# Patient Record
Sex: Female | Born: 1970 | Race: White | Hispanic: No | Marital: Married | Smoking: Never smoker
Health system: Southern US, Community
[De-identification: ages and names within clinical notes are randomized; demographics above are authoritative.]

## PROBLEM LIST (undated history)

## (undated) ENCOUNTER — Inpatient Hospital Stay (HOSPITAL_COMMUNITY): Payer: Self-pay

## (undated) DIAGNOSIS — M549 Dorsalgia, unspecified: Secondary | ICD-10-CM

## (undated) DIAGNOSIS — I471 Supraventricular tachycardia, unspecified: Secondary | ICD-10-CM

## (undated) DIAGNOSIS — M2550 Pain in unspecified joint: Secondary | ICD-10-CM

## (undated) DIAGNOSIS — E559 Vitamin D deficiency, unspecified: Secondary | ICD-10-CM

## (undated) DIAGNOSIS — O09529 Supervision of elderly multigravida, unspecified trimester: Secondary | ICD-10-CM

## (undated) DIAGNOSIS — E039 Hypothyroidism, unspecified: Secondary | ICD-10-CM

## (undated) DIAGNOSIS — M7989 Other specified soft tissue disorders: Secondary | ICD-10-CM

## (undated) DIAGNOSIS — I499 Cardiac arrhythmia, unspecified: Secondary | ICD-10-CM

## (undated) DIAGNOSIS — B009 Herpesviral infection, unspecified: Secondary | ICD-10-CM

## (undated) DIAGNOSIS — D649 Anemia, unspecified: Secondary | ICD-10-CM

## (undated) HISTORY — DX: Anemia, unspecified: D64.9

## (undated) HISTORY — DX: Pain in unspecified joint: M25.50

## (undated) HISTORY — DX: Vitamin D deficiency, unspecified: E55.9

## (undated) HISTORY — DX: Supraventricular tachycardia: I47.1

## (undated) HISTORY — DX: Cardiac arrhythmia, unspecified: I49.9

## (undated) HISTORY — PX: HAND SURGERY: SHX662

## (undated) HISTORY — PX: CARDIAC CATHETERIZATION: SHX172

## (undated) HISTORY — DX: Supervision of elderly multigravida, unspecified trimester: O09.529

## (undated) HISTORY — DX: Other specified soft tissue disorders: M79.89

## (undated) HISTORY — DX: Herpesviral infection, unspecified: B00.9

## (undated) HISTORY — DX: Dorsalgia, unspecified: M54.9

## (undated) HISTORY — DX: Supraventricular tachycardia, unspecified: I47.10

---

## 1997-12-27 ENCOUNTER — Other Ambulatory Visit: Admission: RE | Admit: 1997-12-27 | Discharge: 1997-12-27 | Payer: Self-pay | Admitting: Gynecology

## 1999-01-05 ENCOUNTER — Other Ambulatory Visit: Admission: RE | Admit: 1999-01-05 | Discharge: 1999-01-05 | Payer: Self-pay | Admitting: Gynecology

## 2000-01-13 ENCOUNTER — Other Ambulatory Visit: Admission: RE | Admit: 2000-01-13 | Discharge: 2000-01-13 | Payer: Self-pay | Admitting: Gynecology

## 2001-02-07 ENCOUNTER — Other Ambulatory Visit: Admission: RE | Admit: 2001-02-07 | Discharge: 2001-02-07 | Payer: Self-pay | Admitting: Obstetrics and Gynecology

## 2002-02-12 ENCOUNTER — Other Ambulatory Visit: Admission: RE | Admit: 2002-02-12 | Discharge: 2002-02-12 | Payer: Self-pay | Admitting: Obstetrics and Gynecology

## 2003-02-18 ENCOUNTER — Other Ambulatory Visit: Admission: RE | Admit: 2003-02-18 | Discharge: 2003-02-18 | Payer: Self-pay | Admitting: Obstetrics and Gynecology

## 2003-02-19 ENCOUNTER — Other Ambulatory Visit: Admission: RE | Admit: 2003-02-19 | Discharge: 2003-02-19 | Payer: Self-pay | Admitting: Obstetrics and Gynecology

## 2004-03-05 ENCOUNTER — Other Ambulatory Visit: Admission: RE | Admit: 2004-03-05 | Discharge: 2004-03-05 | Payer: Self-pay | Admitting: Obstetrics and Gynecology

## 2005-03-10 ENCOUNTER — Other Ambulatory Visit: Admission: RE | Admit: 2005-03-10 | Discharge: 2005-03-10 | Payer: Self-pay | Admitting: Obstetrics and Gynecology

## 2006-12-21 ENCOUNTER — Emergency Department (HOSPITAL_COMMUNITY): Admission: EM | Admit: 2006-12-21 | Discharge: 2006-12-21 | Payer: Self-pay | Admitting: Emergency Medicine

## 2007-03-02 HISTORY — PX: NASAL SINUS SURGERY: SHX719

## 2007-05-08 ENCOUNTER — Encounter: Admission: RE | Admit: 2007-05-08 | Discharge: 2007-05-08 | Payer: Self-pay | Admitting: Obstetrics and Gynecology

## 2007-05-17 ENCOUNTER — Encounter: Admission: RE | Admit: 2007-05-17 | Discharge: 2007-05-17 | Payer: Self-pay | Admitting: Obstetrics and Gynecology

## 2007-11-07 ENCOUNTER — Encounter: Admission: RE | Admit: 2007-11-07 | Discharge: 2007-11-07 | Payer: Self-pay | Admitting: Obstetrics and Gynecology

## 2007-11-30 HISTORY — PX: SVT ABLATION: EP1225

## 2007-12-15 ENCOUNTER — Ambulatory Visit: Payer: Self-pay | Admitting: Internal Medicine

## 2007-12-15 LAB — CONVERTED CEMR LAB
Basophils Absolute: 0 10*3/uL (ref 0.0–0.1)
CO2: 27 meq/L (ref 19–32)
Creatinine, Ser: 0.7 mg/dL (ref 0.4–1.2)
GFR calc Af Amer: 122 mL/min
GFR calc non Af Amer: 101 mL/min
Glucose, Bld: 92 mg/dL (ref 70–99)
INR: 1 (ref 0.8–1.0)
MCV: 92.2 fL (ref 78.0–100.0)
Monocytes Relative: 5.5 % (ref 3.0–12.0)
Neutro Abs: 6.5 10*3/uL (ref 1.4–7.7)
Potassium: 3.9 meq/L (ref 3.5–5.1)
Prothrombin Time: 11.8 s (ref 10.9–13.3)
RBC: 4.52 M/uL (ref 3.87–5.11)
RDW: 11.5 % (ref 11.5–14.6)
WBC: 9.7 10*3/uL (ref 4.5–10.5)
aPTT: 30.4 s — ABNORMAL HIGH (ref 21.7–29.8)

## 2007-12-20 ENCOUNTER — Ambulatory Visit: Payer: Self-pay | Admitting: Internal Medicine

## 2007-12-20 ENCOUNTER — Ambulatory Visit (HOSPITAL_COMMUNITY): Admission: RE | Admit: 2007-12-20 | Discharge: 2007-12-21 | Payer: Self-pay | Admitting: Internal Medicine

## 2007-12-27 ENCOUNTER — Ambulatory Visit: Payer: Self-pay | Admitting: Internal Medicine

## 2007-12-27 ENCOUNTER — Ambulatory Visit: Payer: Self-pay

## 2007-12-27 ENCOUNTER — Ambulatory Visit: Payer: Self-pay | Admitting: Cardiology

## 2008-01-18 ENCOUNTER — Ambulatory Visit: Payer: Self-pay | Admitting: Internal Medicine

## 2008-01-24 DIAGNOSIS — R42 Dizziness and giddiness: Secondary | ICD-10-CM | POA: Insufficient documentation

## 2008-01-24 DIAGNOSIS — R0989 Other specified symptoms and signs involving the circulatory and respiratory systems: Secondary | ICD-10-CM | POA: Insufficient documentation

## 2008-05-08 ENCOUNTER — Encounter: Admission: RE | Admit: 2008-05-08 | Discharge: 2008-05-08 | Payer: Self-pay | Admitting: Obstetrics and Gynecology

## 2009-01-06 IMAGING — CR DG FOOT COMPLETE 3+V*R*
3 series · 3 of 3 positions shown · non-contrast
Comparison: none

CLINICAL DATA: Fell at work.  Right foot pain.  Additional clinical history obtained from Emergency Room, staging that the pain is in the region of lateral calcaneus to the base of the 5th metatarsal. 
RIGHT FOOT - 3 VIEW:

[t foot ap right]
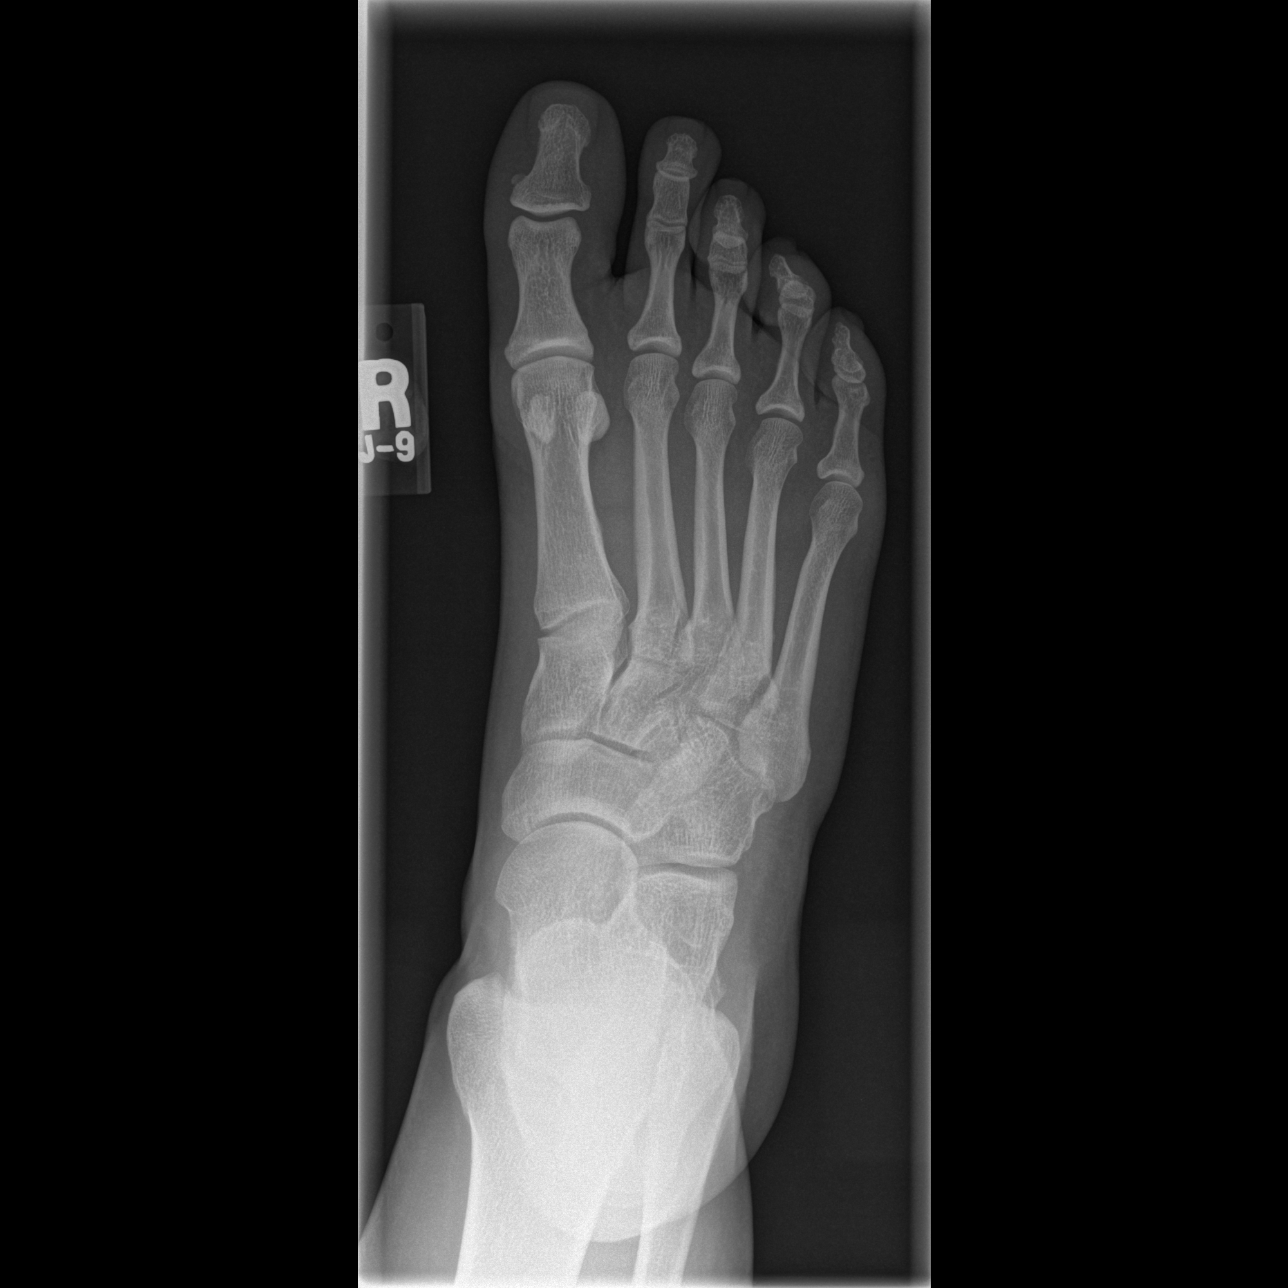

[t foot oblique right]
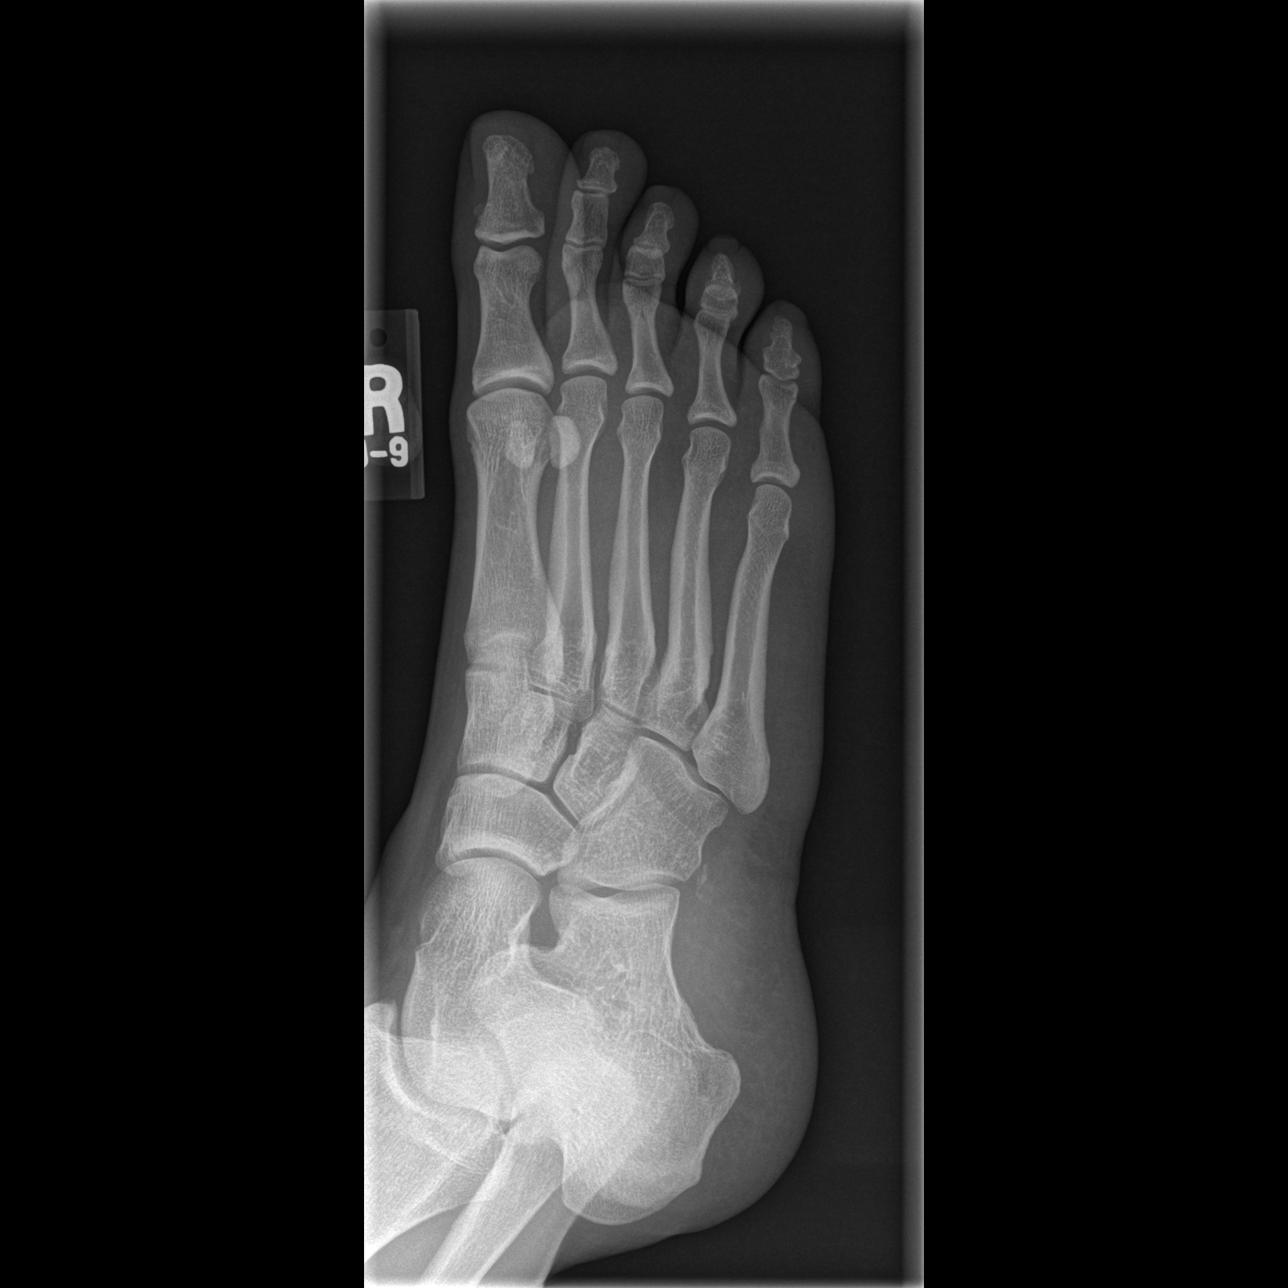

[t foot lat right]
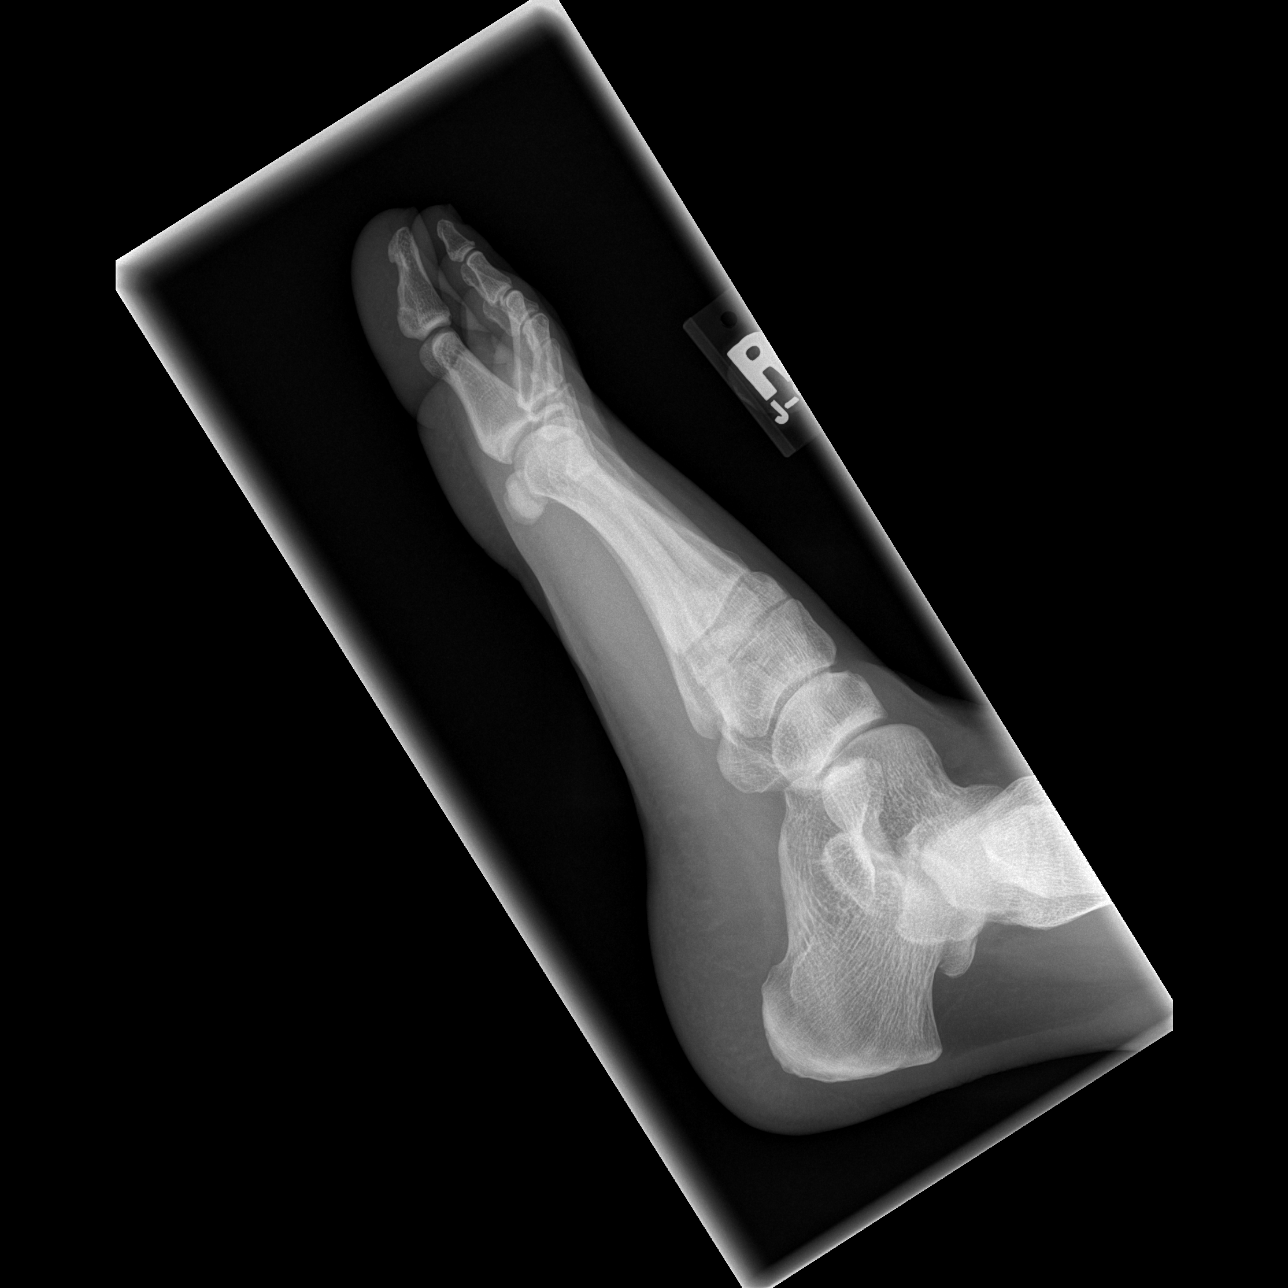

[3 of 3 positions shown; findings below may reference images not displayed]

FINDINGS: Joints at the foot are aligned.  There is an irregular density measuring approximately 7 mm in greatest diameter, just lateral to the cuboid bone, just anterior to the calcaneal cuboid joint.  It may represent a small avulsion fragment.  It was uncertain if this may be associated with a tape or bandage associated with the foot; however, the clinicians report that there is no bandage overlying foot.  The base of the 5th metatarsal is intact.  No other areas seen suspicious for fracture.
IMPRESSION: On the oblique view, there is 7 mm density adjacent to the cuboid bone, for which small avulsion fracture is not excluded.  Finding was discussed with the Emergency Room physician by Dr. Yaman Wise, 12/21/06.

## 2009-05-09 ENCOUNTER — Encounter: Admission: RE | Admit: 2009-05-09 | Discharge: 2009-05-09 | Payer: Self-pay | Admitting: Obstetrics and Gynecology

## 2009-10-30 ENCOUNTER — Encounter: Admission: RE | Admit: 2009-10-30 | Discharge: 2009-10-30 | Payer: Self-pay | Admitting: Obstetrics and Gynecology

## 2010-03-27 ENCOUNTER — Other Ambulatory Visit: Payer: Self-pay | Admitting: Obstetrics and Gynecology

## 2010-03-27 DIAGNOSIS — Z1239 Encounter for other screening for malignant neoplasm of breast: Secondary | ICD-10-CM

## 2010-05-11 ENCOUNTER — Ambulatory Visit
Admission: RE | Admit: 2010-05-11 | Discharge: 2010-05-11 | Disposition: A | Discharge status: Home / Self Care | Payer: Self-pay | Source: Ambulatory Visit | Attending: Obstetrics and Gynecology | Admitting: Obstetrics and Gynecology

## 2010-05-11 DIAGNOSIS — Z1239 Encounter for other screening for malignant neoplasm of breast: Secondary | ICD-10-CM

## 2010-07-14 NOTE — Discharge Summary (Signed)
Destiny [name], Destiny [name]               ACCOUNT NO.:  [id number]   MEDICAL RECORD NO.:  [id number]          PATIENT TYPE:  OIB   LOCATION:  3706                         FACILITY:  [facility]   PHYSICIAN:  Hillis Range, MD       DATE OF BIRTH:  October 16, 1970   DATE OF ADMISSION:  12/20/2007  DATE OF DISCHARGE:                               DISCHARGE SUMMARY   This patient has no known drug allergies and the dictation time is  greater than 30 minutes.   FINAL DIAGNOSES:  1. Episodic heart racing x10 years.      a.     Symptoms are palpitations/chest tightness/fatigue.      b.     Symptoms are increasing in frequency and in duration.  2. Electrophysiology study, December 20, 2007.      a.     Easily inducible atrioventricular nodal reentry tachycardia.      b.     Dual atrioventricular nodal physiology.      c.     Multiple slow atrioventricular nodal pathways.      d.     Radiofrequency catheter ablation with slow pathway       modification.      e.     No recurrent dysrhythmia during electrophysiology study.   SECONDARY DIAGNOSES:  1. History of recurrent syncope since childhood.  2. Postural hypotension.  3. Stress test early October, some evidence of supraventricular      tachycardia and heart rate in 180s.  4. Echocardiogram in October 2009, ejection fraction of 65%.  No      valvular abnormalities.  5. LifeWatch monitor shows narrow complex tachycardia of 180 beats per      minute with concurrent symptoms in the patient of chest tightness,      heart racing, and fatigue.   PROCEDURE:  December 20, 2007, electrophysiology study, radiofrequency  catheter ablation of an easily inducible atrioventricular nodal reentry  tachycardia, Dr. Hillis Range.   BRIEF HISTORY:  [name] is a 40 year old female.  She has a history  of postural hypotension, postural dizziness, and syncope which first  began in childhood.  Over the little year, she had made a successful  lifestyle adjustments that  include drinking excessive water per day and  rising slowly from a seated position.   The patient also reports symptoms of her heart racing.  She describes  these as a fluttering feeling within her chest.  These are associated  with heart pounding, chest tightness, and fatigue.  The episodes usually  last several minutes, lately they have increased in frequency and  duration.  These have been present for approximately 10 years.  The  patient describes these episodes as abrupt in onset with gradual  termination.  She cannot identify any triggers for these dysrhythmias.  She is not able to terminate them with vagal maneuvers.   The patient is a [place] distance runners, sometimes she develops acute  loss of her exercise intolerance as if air is going out of a balloon.  She is usually able to run about 8 miles.  With  several episodes, she  has had acute onset of fatigue with tunnel vision and shortness of  breath and then she has had to stop running.  She has recently been  evaluated and diagnosed with exercise-induced asthma.  She does find the  bronchodilators have helped her exercise tolerance to some degree;  however, the episode of heart racing have increased.   The patient has had a prior workup at [facility].  This study  showed that the ejection fraction of this patient is preserved at 65%.  She also underwent a stress test where her heart rate achieved a maximum  of 180 beats per minute.  The electrocardiogram looked as if there were  some evidence of SVT.  The patient did wear a LifeWatch monitor with  successfully documented a narrow complex tachycardia with concurrence to  the patient's symptoms as described above.  This episode lasted about 1-  1/2 hours.   Therapeutic strategies for SVT, which includes medical therapy.  This  would be fairly contraindicated due to the patient's postural  hypotension.  An alternative would be electrophysiology study and  radiofrequency  catheter ablation.  The risks and benefits have been  described for this procedure to the patient and family and they wish to  proceed.   HOSPITAL COURSE:  The patient presents electively on December 20, 2007.  She underwent electrophysiology study with successful ablation of a slow  AV nodal reentry pathway.  There was no recurrence of dysrhythmia after  repeated efforts to induce.  The patient's discharge is later on the day  of the procedure or the following day with following medications;  1. Astepro 137 mcg per spray, 1-2 sprays per nostril twice daily, but      the patient usually takes this at bedtime.  2. Singulair 10 mg daily at bedtime.  3. Aviane 1 tablet daily.  4. Claritin 10 mg daily.   The patient also apparently uses bronchodilators at home.  She follows  up with Dr. Johney Frame at [facility], [address]  Thursday January 18, 2008, at 9 o'clock.   Lab studies pertinent to this admission were drawn on December 15, 2007.  White cells 9.7, hemoglobin 14.6, hematocrit 41.7, and platelets of 285.  Protime 11.8, INR 1.0, sodium 141, potassium 3.9, chloride 106,  bicarbonate 27, glucose 92, BUN is 12, creatinine is 0.7.  Also, there  is a pregnancy study, which was negative.      [name], [place]      Hillis Range, MD  Electronically Signed    GM/MEDQ  D:  12/20/2007  T:  12/21/2007  Job:  [id number]   cc:   Jethro Bastos, M.D.  Jake Bathe, MD  Emeterio Reeve, MD

## 2010-07-14 NOTE — Assessment & Plan Note (Signed)
[facility]                         ELECTROPHYSIOLOGY OFFICE NOTE   Destiny [name], Destiny [name]                      MRN:          [id number]  DATE:12/27/2007                            DOB:          [birth year]    Destiny [name] is seen because of pleuritic chest discomfort in the wake of  a catheter ablation for slow-fasting AV node re-entrant tachycardia that  was done last week.  This discomfort developed yesterday.  It was more  noticeable with recumbency then sitting up.  She described it has left  sided somewhat deep to her breast.  It is unaccompanied by shortness of  breath.  She has had no right leg swelling, which was the indwelling  site.   Her medications are notable for the absence of aspirin.     On examination today, her blood pressure was actually not taken, her  heart rate was 75.  Her neck veins were flat.  Her lungs were clear.  Her heart sounds were regular.  There was no rub heard either flat or  upright.  There was a little bit of chest wall tenderness in the left of  the sternum, but this did not reproduce her pain.  The extremities were  without edema and there was no swelling or tenderness of the right lower  leg.   Electrocardiogram demonstrated sinus rhythm at 77 with intervals of  0.14/0.09/0.42.  The axis was 87 degrees, which was consistent with her  prior axis.   CT scan done demonstrated no significant pericardial effusion or  pulmonary embolism.   IMPRESSION:  1. Postprocedural pleuritic chest pain.  2. 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  3. Dysautonomia.   Destiny [name]'s symptoms were concerning for pulmonary embolism and the  fact that we do not see anything makes me wonder whether there is a very  small peripheral embolism.  The issue then would be whether there is a  larger clot left behind, so we will undertake a venous Doppler this  afternoon.  In the event  that this is negative, I have asked her to go  ahead and start taking nonsteroidal.  She is scheduled to follow up with  Dr. Johney Frame and she is advised to keep that appointment.   In the event that a venous Doppler abnormalities are found, she will  need to be hospitalized for heparin.     Duke Salvia, MD, [facility]  Electronically Signed    SCK/MedQ  DD: 12/27/2007  DT: 12/28/2007  Job #: [id number]

## 2010-07-14 NOTE — Letter (Signed)
December 15, 2007    Jake Bathe, MD  [address]  [place], [place] [address]   RE:  FLOETTA, BRICKEY  MRN:  [id number]  /  DOB:  September 10, 1970   Dear Loraine Leriche,   It was my pleasure to see your patient, Destiny [name], in EP  consultation today.  As you recall, she is a very pleasant 40 year old  female with a history of postural hypotension and recently diagnosed  tachycardia.  She has a Gaultney-standing history of postural dizziness and  syncope which she reports first began in childhood.  Over the years, she  has made lifestyle adjustments including drinking and excess of 1 L of  water per day and typically rising slowly from a seated position.  She  reports that her last episode of orthostatic syncope occurred 2-3 years  ago.  She reports while at home getting ready she stood up quickly and  then became lightheaded.  She reports sitting on the toilet and putting  her head between her knees.  She subsequently passed out for several  seconds.  She has had no subsequent syncope.  The patient also reports  separate symptoms of heart racing.  She describes these as a  fluttering within her chest with associated heart pounding, chest  tightness, and fatigue.  These episodes typically last several minutes,  but have recently increased in both frequency and duration.  She reports  that they have been present for approximately 10 years.  Over the past  few months, they have increased to occurring on a weekly basis.  She  describes these episodes as abrupt onset with gradual termination.  She  has been unable to identify any triggers and has not been able to  terminate her episodes with vagal maneuvers.  She also notes that when  she runs for [place] distances that she occasionally develops acute loss of  exercise tolerance.  She reports that typically she is fit and able to  run approximately 8 miles.  On several episodes, she has had acute onset  of fatigue with tunnel vision and shortness  of breath for which she has  had to stop immediately approximately 2.5 miles into her run.  She has  been recently evaluated and diagnosed with exercise-induced asthma.  She  does find that bronchodilators have helped her exercise tolerance to  some degree; however, her episodes of heart racing have increased.   Upon being seen recently in your office, a was performed which revealed  no exercise induced arrhythmias.  Interestingly, after the GXT while  obtaining an echocardiogram she was apparently observed to have abrupt  onset of a tachycardia in the 180s range.  He therefore placed a  LifeWatch monitor which has subsequently successfully documented a  narrow complex tachycardia at 180 beats per minute.  The most recent of  these episodes document was on December 11, 2007, at 10:32 a.m.  The  patient reports that this was during an episode of feeling very bad  with heart racing, pounding, chest tightness, and fatigue.  She did not  have tunneled vision during this episode.  She reports that this episode  lasted approximately 1-1/2 hours.   PAST MEDICAL HISTORY:  1. Supraventricular tachycardia (as above).  2. Postural hypotension with prior syncope.  3. Chronic allergic rhinitis.  4. Recurrent otitis and sinusitis.  5. Asthma.  6. Anemia.   CURRENT MEDICATIONS:  1. Claritin 10 mg daily.  2. Aviane daily.  3. Multivitamin daily.  4. Singulair 10 mg nightly.  5. Astepro 137 mcg nightly.   ALLERGIES:  No known drug allergies.  The patient reports having a  reaction to a flu shot previously.   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  She does not smoke or use drugs and rarely  drinks alcohol.   FAMILY HISTORY:  The patient's paternal uncle died suddenly while  hunting at a young age.  The patient's paternal grandfather died  suddenly in his sleep.  The patient's mother has recurrent orthostatic  dizziness.   REVIEW  OF SYSTEMS:  All systems were reviewed and negative except as  outlined above.   PHYSICAL EXAMINATION:  VITALS:  Blood pressure 112/68, heart rate 85,  respirations 18, weight 145 pounds.  GENERAL:  The patient is a thin, tall female in no acute distress.  She  is alert and oriented x3.  HEENT:  Normocephalic, atraumatic.  Sclerae clear.  Conjunctivae pink.  Oropharynx clear.  NECK:  Supple.  No JVD, lymphadenopathy, or bruits.  LUNGS:  Clear to auscultation bilaterally.  HEART:  Regular rate and rhythm.  No murmurs, rubs, or gallops.  GI:  Soft, nontender, nondistended.  Positive bowel sounds.  EXTREMITIES:  No clubbing, cyanosis, or edema.  NEUROLOGIC:  Cranial nerves II through XII are intact.  Strength and  sensation are intact.  SKIN:  No ecchymosis or lacerations.  MUSCULOSKELETAL:  No deformity or atrophy.  PSYCHIATRY:  Euthymic mood, full affect.   EKG today reveals normal sinus rhythm.  Her QT is 434 msec.  The EKG is  otherwise normal   IMPRESSION:  Destiny [name] is a very pleasant 40 year old female with a  Khim-standing history of orthostatic hypotension and postural syncope  who now presents with symptomatic supraventricular tachycardia.  The  tachycardia appears to be a short RP tachycardia and may represent  atrioventricular nodal re-entrant tachycardia.  I am unable to discern  clear P-waves from the tracing and the diagnosis based on the LifeWatch  tracing is quite limited.  I agree with you that her postural dizziness  is unrelated to her supraventricular tachycardia.  I do think that she  is quite symptomatic with supraventricular tachycardia and also that her  episodes of abrupt decreased exercise tolerance while running maybe  secondary to supraventricular tachycardia.  Given the patient's Schue-  standing severe postural hypotension, I do not think that she could be  treated medically with beta-blockers or calcium channel blockers as this  would likely worsen her  postural symptoms.  I therefore think that we  should consider ablation.   PLAN:  Therapeutic strategies for supraventricular tachycardia including  both medicine and catheter-based therapies were discussed in detail with  the patient today.  The risks, benefits, and alternatives to EP study  and radiofrequency ablation for supraventricular tachycardia were also  discussed.  These risks include, but were not limited to bleeding,  vascular damage, pericardial effusion with tamponade, AV nodal block  with need for pacemaker.  The patient understands these risks and wishes  to proceed.  This discussion was held with the patient's father in the  room.  I have also provided the patient with a pamphlet on therapy for  supraventricular tachycardia.  We will schedule the patient for an SVT  ablation at the next available time.  Regarding her orthostatic  hypotension, I agree with you the lifestyle modification with  liberalization of her salt is the most  appropriate strategy at this  time.   Thank you for the opportunity to participate in the care Destiny [name].    Sincerely,      Hillis Range, MD  Electronically Signed    JA/MedQ  DD: 12/15/2007  DT: 12/16/2007  Job #: [id number]   CC:   Randye Lobo, M.D.  Emeterio Reeve, MD

## 2010-07-14 NOTE — Op Note (Signed)
Destiny [name], Destiny [name]               ACCOUNT NO.:  [id number]   MEDICAL RECORD NO.:  [id number]          PATIENT TYPE:  OIB   LOCATION:  2899                         FACILITY:  [facility]   PHYSICIAN:  Hillis Range, MD       DATE OF BIRTH:  1970-12-28   DATE OF PROCEDURE:  12/20/2007  DATE OF DISCHARGE:                               OPERATIVE REPORT   PREPROCEDURE DIAGNOSIS:  Supraventricular tachycardia.   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   PROCEDURES:  1. Diagnostic electrophysiologic study.  2. Coronary sinus pacing and recording.  3. Mapping of supraventricular tachycardia.  4. Radiofrequency ablation of supraventricular tachycardia.   DESCRIPTION OF PROCEDURE:  Informed and written consent was obtained and  the patient was brought to the Electrophysiology Lab in the fasting  state.  She was adequately sedated with intravenous Versed and fentanyl  as outlined in the nursing report.  The patient's right neck and groin  were prepped and draped in the usual sterile fashion by the EP lab  staff.  Using a percutaneous Seldinger technique, one 6-French  hemostasis sheath was placed into the right internal jugular vein.  A 6-  French curved hexapolar Josephson catheter was introduced through the  right internal jugular vein and advanced into the coronary sinus for  recording and pacing from this location.  Two 6-French and one 7-French  hemostasis sheaths were placed in the right common femoral vein.  Two 6-  French quadripolar Josephson catheters were introduced through the right  common femoral vein and advanced into the His bundle and right  ventricular apex positions respectively.  The patient presented to the  Electrophysiology Lab in normal sinus rhythm.  Her AH interval measured  72 milliseconds with an HV interval of 44 milliseconds.  The PR interval  measured 118 milliseconds with an RR  interval of 919 milliseconds and a  QT duration of 389 milliseconds.  The QRS duration was 103 milliseconds.  The patient was found to have easily inducible supraventricular  tachycardia with catheter manipulation within the right atrium.  A 1:1  AV conduction was observed with a tachycardia cycle length of 454  milliseconds.  The VA time measured 28 milliseconds with the earliest  atrial activation recorded from the His ablation electrode.  The  tachycardia began with PR prolongation and ended with an atrial  activation.  This tachycardia was therefore felt to be consistent with  classic AV nodal reentrant tachycardia with antegrade conduction of a  slow AV nodal pathway and retrograde conduction over the fast pathway.  Additional testing was therefore performed.  Pacing from the right  ventricle was performed which revealed midline concentric decremental VA  conduction with a VA Wenckebach cycle length of 360 milliseconds.  Ventricular extrastimulus testing was performed which revealed midline  decremental concentric VA conduction with no tachycardia induced.  A  retrograde AH jump was observed.  The ventricular ERP was 500/230  milliseconds.  Rapid atrial pacing was then performed which revealed an  AV Wenckebach cycle length of 470 milliseconds with PR greater  than RR  and short nonsustained tachycardia induced.  Atrial extrastimulus  testing was performed which revealed multiple AH jumps with single echo  beats observed.  The PR interval prolonged out to 513 milliseconds with  a single echo beat observed.  The atrial ERP was 600/260 milliseconds.  We therefore elected to ablate the slow AV nodal pathway.  A 7-French  Biosense Webster 4-mm ablation catheter was introduced through the right  common femoral vein and advanced into the right atrium.  The  electroanatomical mapping of Koch's triangle was performed.  The  ablation catheter was positioned at sites 8 through 10 in Koch's   triangle.  During the third radiofrequency application, accelerated  junctional rhythm was observed with intact VA conduction.  Following  ablation of lesion, atrial extrastimulus testing was performed which  revealed a persistent AH jump with AVNRT induced when pacing at 500/480  milliseconds.  An additional radiofrequency application was therefore  delivered at site 7 in Koch's triangle which produced accelerated  junctional rhythm with intact VA conduction.  This lesion was delivered  for a duration of 60 seconds.  Following ablation, atrial extrastimulus  testing was again performed which revealed a single AH jump with no echo  beats.  The clear AV nodal ERP was 500/390 milliseconds.  Rapid atrial  pacing was again performed which revealed an AV Wenckebach cycle length  of 460 milliseconds with no tachycardias induced.  Rapid ventricular  pacing was performed which revealed midline concentric decremental VA  conduction with a VA Wenckebach cycle length of 340 milliseconds with no  tachycardia induced.  Ventricular extrastimulus testing was performed  down to a ventricular ERP of 500/230 milliseconds with no AH jumps or  tachycardia induced.  Midline decremental VA conduction was observed.  The patient was observed for 15 minutes.  Following this observation,  atrial extrastimulus testing was again performed which revealed midline  concentric decremental VA conduction with a single AH jump but no echo  beats observed.  The atrial ERP was 500/390 milliseconds.  The procedure  was therefore considered completed.  Following ablation, the AH interval  measured 43 milliseconds.  The procedure was therefore considered  completed.  All catheters were removed and the sheaths were aspirated  and flushed.  The sheaths were removed and hemostasis was assured.  There were no early apparent complications.   CONCLUSIONS:  1. Normal sinus rhythm upon presentation.  2. 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  3. Successful radiofrequency ablation of the slow atrioventricular      nodal pathway with no inducible arrhythmias following ablation.  4. No early apparent complications.      Hillis Range, MD  Electronically Signed     JA/MEDQ  D:  12/20/2007  T:  12/20/2007  Job:  [id number]   cc:   Jake Bathe, MD

## 2010-07-14 NOTE — Letter (Signed)
January 18, 2008    Jake Bathe, MD  [address]  [place], [place] [address]   RE:  Destiny, [name]  MRN:  [id number]  /  DOB:  [birth year]   Dear Destiny [name].   It was my pleasure to see your patient, Destiny [name], in  Electrophysiology Clinic today for followup after her recent ablation.  As you recall, she is a very pleasant 40 year old female with a history  of postural hypotension and recently diagnosed AV nodal reentrant  tachycardia.  She underwent EP study in December 20, 2007, which revealed  dual AV nodal physiology with inducible and incessant atrioventricular  nodal reentrant tachycardia with multiple slow pathways demonstrated.  She underwent successful radiofrequency ablation of the slow AV nodal  pathway.  Following the ablation, she had no inducible arrhythmias.  She  reports doing very well since that time.  She has had no further  symptomatic episodes of tachycardia.  The patient did develop atypical  chest discomfort approximately 1 week following ablation for which she  had a CT scan of the chest performed which revealed no pericardial  effusion, pneumothorax, or pulmonary embolus.  Since that time, she has  had significant improvement in the chest discomfort.  In retrospect, she  has had an upper respiratory tract infection for approximately 6 weeks  and has had significant coughing.  She does note that her chest  discomfort is often worsened with deep inspiration.  She has had no  relation of her chest discomfort to exertion.  Otherwise, she has done  well.  She is currently without complaint today.   PROBLEM LIST:  1. Postural hypertension.  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  3. Chronic allergic rhinitis.  4. Recurrent otitis and sinusitis.  5. Asthma.  6. Anemia.   CURRENT MEDICATIONS:  1. Astepro 137 mcg nightly.  2.  Singulair 10 mg nightly.  3. Multivitamin daily.  4. Aviane daily.  5. Claritin 10 mg daily.   PHYSICAL EXAMINATION:  VITAL SIGNS:  Blood pressure 112/74, heart rate  69, respirations 18, and weight 148 pounds.  GENERAL:  The patient is a well-appearing female in no acute distress.  She is alert and oriented x3.  HEENT:  Normocephalic and atraumatic.  Sclerae are clear.  Conjunctivae  are pink.  Oropharynx is clear.  NECK:  Supple.  No JVD, lymphadenopathy, or bruits.  LUNGS:  Clear to auscultation bilaterally.  HEART:  Regular rate and rhythm.  No murmurs, rubs, or gallops.  GI:  Soft, nontender, and nondistended.  Positive bowel sounds.  EXTREMITIES:  No clubbing, cyanosis, or edema.  NEUROLOGIC:  Strength and sensation are intact.  SKIN:  No ecchymosis or lacerations.  MUSCULOSKELETAL:  No deformity or atrophy.   EKG today reveals normal sinus rhythm at 70 beats per minute, right axis  deviation is noted.   IMPRESSION:  Destiny [name] is a very pleasant 40 year old female with [place]-  standing orthostatic hypotension and a recently diagnosed  atrioventricular nodal reentrant tachycardia.  She is currently doing  well status post ablation for atrioventricular nodal reentrant  tachycardia.  She has had some atypical chest discomfort, which I  believe is due to a recent coughing and upper respiratory tract  infection.  Her postural hypertension appears to be well controlled.   PLAN:  I have made no medication changes today.  I have asked the  patient to resume her previously scheduled followup with you for further  management of postural hypotension.  I believe that her tachycardia has  been cured; however, should she develop any recurrent symptomatic  supraventricular tachycardia, I would appreciate that if you would give  me a call.  Again, I appreciate the opportunity to participate in Destiny [name]'s care.  I return the entirety of her care to you.    Sincerely,      Hillis Range, MD  Electronically Signed    JA/MedQ  DD: 01/18/2008  DT: 01/18/2008  Job #: [id number]   CC:   Randye Lobo, M.D.  Emeterio Reeve, MD

## 2010-08-24 LAB — CBC
Hemoglobin: 14.3 g/dL (ref 12.0–16.0)
Platelets: 285 10*3/uL (ref 150–399)

## 2010-08-24 LAB — ANTIBODY SCREEN: Antibody Screen: NEGATIVE

## 2010-08-24 LAB — ABO/RH

## 2010-08-24 LAB — GC/CHLAMYDIA PROBE AMP, GENITAL: Chlamydia: NEGATIVE

## 2010-08-24 LAB — RUBELLA ANTIBODY, IGM: Rubella: IMMUNE

## 2010-08-24 LAB — HIV ANTIBODY (ROUTINE TESTING W REFLEX): HIV: NONREACTIVE

## 2011-02-17 LAB — STREP B DNA PROBE: GBS: NEGATIVE

## 2011-03-18 ENCOUNTER — Encounter (HOSPITAL_COMMUNITY): Payer: Self-pay

## 2011-03-18 ENCOUNTER — Encounter (HOSPITAL_COMMUNITY): Payer: Self-pay | Admitting: *Deleted

## 2011-03-18 ENCOUNTER — Inpatient Hospital Stay (HOSPITAL_COMMUNITY)
Admission: RE | Admit: 2011-03-18 | Discharge: 2011-03-22 | DRG: 371 | Disposition: A | Discharge status: Home / Self Care | Payer: Federal, State, Local not specified - PPO | Source: Ambulatory Visit | Attending: Obstetrics and Gynecology | Admitting: Obstetrics and Gynecology

## 2011-03-18 ENCOUNTER — Telehealth (HOSPITAL_COMMUNITY): Payer: Self-pay | Admitting: *Deleted

## 2011-03-18 DIAGNOSIS — O324XX0 Maternal care for high head at term, not applicable or unspecified: Secondary | ICD-10-CM | POA: Diagnosis present

## 2011-03-18 DIAGNOSIS — O09519 Supervision of elderly primigravida, unspecified trimester: Secondary | ICD-10-CM | POA: Diagnosis present

## 2011-03-18 LAB — CBC
MCHC: 34 g/dL (ref 30.0–36.0)
RDW: 13.6 % (ref 11.5–15.5)
WBC: 9.2 10*3/uL (ref 4.0–10.5)

## 2011-03-18 MED ORDER — FLEET ENEMA 7-19 GM/118ML RE ENEM: 1.0000 | ENEMA | RECTAL | Status: DC | PRN

## 2011-03-18 MED ORDER — OXYCODONE-ACETAMINOPHEN 5-325 MG PO TABS: 2.0000 | ORAL_TABLET | ORAL | Status: DC | PRN

## 2011-03-18 MED ORDER — TERBUTALINE SULFATE 1 MG/ML IJ SOLN: 0.2500 mg | Freq: Once | INTRAMUSCULAR | Status: AC | PRN

## 2011-03-18 MED ORDER — CITRIC ACID-SODIUM CITRATE 334-500 MG/5ML PO SOLN
30.0000 mL | ORAL | Status: DC | PRN
  Administered 2011-03-20: 30 mL via ORAL
  Filled 2011-03-18: qty 15

## 2011-03-18 MED ORDER — LACTATED RINGERS IV SOLN
500.0000 mL | INTRAVENOUS | Status: DC | PRN
  Administered 2011-03-19: 500 mL via INTRAVENOUS

## 2011-03-18 MED ORDER — ACETAMINOPHEN 325 MG PO TABS: 650.0000 mg | ORAL_TABLET | ORAL | Status: DC | PRN

## 2011-03-18 MED ORDER — OXYTOCIN 20 UNITS IN LACTATED RINGERS INFUSION - SIMPLE
1.0000 m[IU]/min | INTRAVENOUS | Status: DC
  Administered 2011-03-18: 1 m[IU]/min via INTRAVENOUS
  Filled 2011-03-18: qty 1000

## 2011-03-18 MED ORDER — LACTATED RINGERS IV SOLN
INTRAVENOUS | Status: DC
  Administered 2011-03-18 – 2011-03-19 (×2): via INTRAVENOUS
  Administered 2011-03-20: 125 mL/h via INTRAVENOUS

## 2011-03-18 MED ORDER — OXYTOCIN BOLUS FROM INFUSION
500.0000 mL | Freq: Once | INTRAVENOUS | Status: DC
  Filled 2011-03-18: qty 500

## 2011-03-18 MED ORDER — OXYTOCIN 20 UNITS IN LACTATED RINGERS INFUSION - SIMPLE: 125.0000 mL/h | Freq: Once | INTRAVENOUS | Status: DC

## 2011-03-18 MED ORDER — ZOLPIDEM TARTRATE 5 MG PO TABS
5.0000 mg | ORAL_TABLET | Freq: Every evening | ORAL | Status: DC | PRN
  Administered 2011-03-18: 5 mg via ORAL
  Filled 2011-03-18: qty 1

## 2011-03-18 MED ORDER — LIDOCAINE HCL (PF) 1 % IJ SOLN
30.0000 mL | INTRAMUSCULAR | Status: DC | PRN
  Filled 2011-03-18: qty 30

## 2011-03-18 MED ORDER — ONDANSETRON HCL 4 MG/2ML IJ SOLN
4.0000 mg | Freq: Four times a day (QID) | INTRAMUSCULAR | Status: DC | PRN
  Administered 2011-03-19 – 2011-03-20 (×2): 4 mg via INTRAVENOUS
  Filled 2011-03-18 (×2): qty 2

## 2011-03-18 MED ORDER — IBUPROFEN 600 MG PO TABS: 600.0000 mg | ORAL_TABLET | Freq: Four times a day (QID) | ORAL | Status: DC | PRN

## 2011-03-18 NOTE — Telephone Encounter (Signed)
Preadmission screen  

## 2011-03-18 NOTE — H&P (Addendum)
41 y.o. [redacted]w[redacted]d  G1P0 comes in for term induction of labor.  Otherwise has good fetal movement and no bleeding.  Past Medical History  Diagnosis Date  . AMA (advanced maternal age) multigravida 35+   . Herpes   . 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 Asthma     exercise induced  . Hypotension     Past Surgical History  Procedure Date  . Hand surgery   . Nasal sinus surgery 2009    OB History    Grav Para Term Preterm Abortions TAB SAB Ect Mult Living   1              # Outc Date GA Lbr Len/2nd Wgt Sex Del Anes PTL Lv   1 CUR               History   Social History  . Marital Status: Married    Spouse Name: N/A    Number of Children: N/A  . Years of Education: N/A   Occupational History  . Not on file.   Social History Main Topics  . Smoking status: Never Smoker   . Smokeless tobacco: Never Used  . Alcohol Use: No  . Drug Use: No  . Sexually Active: Yes   Other Topics Concern  . Not on file   Social History Narrative  . No narrative on file   Other   Prenatal Course:  AMA, HSV, hx of SVT ablation  Filed Vitals:   03/18/11 2016  BP: 156/74  Pulse: 87  Temp: 97.7 F (36.5 C)  Resp: 20     Lungs/Cor:  NAD Abdomen:  soft, gravid Ex:  no cords, erythema SVE:  pending FHTs:  140, good STV, reactive Toco:  q 2-3   A/P  Admit to L&D for IOL  GBS Neg  Routine care  T/C misoprostol vs. Low dose pitocin if ctx >13 qhr  [name]

## 2011-03-19 ENCOUNTER — Encounter (HOSPITAL_COMMUNITY): Payer: Self-pay | Admitting: Anesthesiology

## 2011-03-19 LAB — RPR
RPR Ser Ql: NONREACTIVE
RPR: NONREACTIVE

## 2011-03-19 MED ORDER — FENTANYL 2.5 MCG/ML BUPIVACAINE 1/10 % EPIDURAL INFUSION (WH - ANES)
14.0000 mL/h | INTRAMUSCULAR | Status: DC
  Administered 2011-03-19 (×2): 14 mL/h via EPIDURAL
  Filled 2011-03-19 (×3): qty 60

## 2011-03-19 MED ORDER — TERBUTALINE SULFATE 1 MG/ML IJ SOLN: 0.2500 mg | Freq: Once | INTRAMUSCULAR | Status: AC | PRN

## 2011-03-19 MED ORDER — EPHEDRINE 5 MG/ML INJ
10.0000 mg | INTRAVENOUS | Status: DC | PRN
  Filled 2011-03-19: qty 4

## 2011-03-19 MED ORDER — FENTANYL 2.5 MCG/ML BUPIVACAINE 1/10 % EPIDURAL INFUSION (WH - ANES)
INTRAMUSCULAR | Status: DC | PRN
  Administered 2011-03-19: 14 mL/h via EPIDURAL

## 2011-03-19 MED ORDER — LACTATED RINGERS IV SOLN: 500.0000 mL | Freq: Once | INTRAVENOUS | Status: DC

## 2011-03-19 MED ORDER — LIDOCAINE HCL 1.5 % IJ SOLN
INTRAMUSCULAR | Status: DC | PRN
  Administered 2011-03-19 (×2): 4 mL via EPIDURAL

## 2011-03-19 MED ORDER — MISOPROSTOL 25 MCG QUARTER TABLET
25.0000 ug | ORAL_TABLET | Freq: Once | ORAL | Status: DC
  Filled 2011-03-19: qty 0.25

## 2011-03-19 MED ORDER — OXYTOCIN 20 UNITS IN LACTATED RINGERS INFUSION - SIMPLE
1.0000 m[IU]/min | INTRAVENOUS | Status: DC
  Administered 2011-03-19: 24 m[IU]/min via INTRAVENOUS
  Filled 2011-03-19: qty 1000

## 2011-03-19 MED ORDER — DIPHENHYDRAMINE HCL 50 MG/ML IJ SOLN: 12.5000 mg | INTRAMUSCULAR | Status: DC | PRN

## 2011-03-19 MED ORDER — EPHEDRINE 5 MG/ML INJ: 10.0000 mg | INTRAVENOUS | Status: DC | PRN

## 2011-03-19 MED ORDER — PHENYLEPHRINE 40 MCG/ML (10ML) SYRINGE FOR IV PUSH (FOR BLOOD PRESSURE SUPPORT): 80.0000 ug | PREFILLED_SYRINGE | INTRAVENOUS | Status: DC | PRN

## 2011-03-19 MED ORDER — PHENYLEPHRINE 40 MCG/ML (10ML) SYRINGE FOR IV PUSH (FOR BLOOD PRESSURE SUPPORT)
80.0000 ug | PREFILLED_SYRINGE | INTRAVENOUS | Status: DC | PRN
  Filled 2011-03-19: qty 5

## 2011-03-19 NOTE — Anesthesia Preprocedure Evaluation (Signed)
Anesthesia Evaluation  Patient identified by MRN, date of birth, ID band Patient awake    Reviewed: Allergy & Precautions, H&P , Patient's Chart, lab work & pertinent test results  Airway Mallampati: III TM Distance: >3 FB Neck ROM: Full    Dental No notable dental hx. (+) Teeth Intact   Pulmonary asthma ,  clear to auscultation  Pulmonary exam normal       Cardiovascular + dysrhythmias Supra Ventricular Tachycardia Regular Normal S/P Ablation of accessory pathway   Neuro/Psych Negative Neurological ROS  Negative Psych ROS   GI/Hepatic negative GI ROS, Neg liver ROS,   Endo/Other  Negative Endocrine ROS  Renal/GU negative Renal ROS  Genitourinary negative   Musculoskeletal negative musculoskeletal ROS (+)   Abdominal Normal abdominal exam  (+)   Peds  Hematology negative hematology ROS (+)   Anesthesia Other Findings   Reproductive/Obstetrics (+) Pregnancy                           Anesthesia Physical Anesthesia Plan  ASA: II  Anesthesia Plan: Epidural   Post-op Pain Management:    Induction:   Airway Management Planned:   Additional Equipment:   Intra-op Plan:   Post-operative Plan:   Informed Consent: I have reviewed the patients History and Physical, chart, labs and discussed the procedure including the risks, benefits and alternatives for the proposed anesthesia with the patient or authorized representative who has indicated his/her understanding and acceptance.     Plan Discussed with: CRNA, Anesthesiologist and Surgeon  Anesthesia Plan Comments:         Anesthesia Quick Evaluation

## 2011-03-19 NOTE — Anesthesia Procedure Notes (Signed)
Epidural Patient location during procedure: OB Start time: 03/19/2011 4:14 PM  Staffing Anesthesiologist: Khiana Camino A.  Preanesthetic Checklist Completed: patient identified, site marked, surgical consent, pre-op evaluation, timeout performed, IV checked, risks and benefits discussed and monitors and equipment checked  Epidural Patient position: sitting Prep: site prepped and draped and DuraPrep Patient monitoring: continuous pulse ox and blood pressure Approach: midline Injection technique: LOR air  Needle:  Needle type: Tuohy  Needle gauge: 17 G Needle length: 9 cm Needle insertion depth: 5 cm cm Catheter type: closed end flexible Catheter size: 19 Gauge Catheter at skin depth: 10 cm Test dose: negative and 1.5% lidocaine  Assessment Events: blood not aspirated, injection not painful, no injection resistance, negative IV test and no paresthesia  Additional Notes Patient identified. Risks and benefits discussed including failed block, incomplete  Pain control, post dural puncture headache, nerve damage, paralysis, blood pressure Changes, nausea, vomiting, reactions to medications-both toxic and allergic and post Partum back pain. All questions were answered. Patient expressed understanding and wished to proceed. Sterile technique was used throughout procedure. Epidural site was Dressed with sterile barrier dressing. No paresthesias, signs of intravascular injection Or signs of intrathecal spread were encountered.  Patient was more comfortable after the epidural was dosed. Please see RN's note for documentation of vital signs and FHR which are stable.

## 2011-03-19 NOTE — Progress Notes (Signed)
Patient ID: Destiny [name], female   DOB: 05-Mar-1970, 41 y.o.   MRN: [id number]  S: Having some nausea and vomiting, occasionally feeling pressure O:  Filed Vitals:   03/19/11 1930 03/19/11 2000 03/19/11 2030 03/19/11 2100  BP: 128/80 124/70 121/54 124/59  Pulse: 92 96 95 97  Temp:  98.2 F (36.8 C)    TempSrc:  Oral    Resp: 18 18 18 18   Height:      Weight:      SpO2:       AOX3, NAD Cvx 5-6/80/-1 toco Q 3-4, irregular FHT: 150 + small accels 5bpm variability.  +scalp stimulation  IUPC placed without difficulty  A/P 1) Overall FWB reassuring 2) Labor inadequate, continue to increase pitocin for adequate labor

## 2011-03-19 NOTE — Progress Notes (Signed)
Patient ID: Destiny [name], female   DOB: [birth year], 41 y.o.   MRN: [id number]  S: Patient comfortable. Pt had questionable SROM @ 0600. OCeasar Mons Vitals:   03/19/11 0701 03/19/11 0731 03/19/11 0733 03/19/11 0801  BP: 123/71 81/56 120/74 109/52  Pulse: 74 71 80 77  Temp:    97.8 F (36.6 C)  TempSrc:    Oral  Resp: 18  18 20   Height:      Weight:       Cvx 1-2/70/-2 toco irregular FHT 145 reactive with accels, no decels  AROM clear fluid  A/P 1) Restart Pit 2) FWB reassuring

## 2011-03-20 ENCOUNTER — Inpatient Hospital Stay (HOSPITAL_COMMUNITY): Payer: Federal, State, Local not specified - PPO | Admitting: Anesthesiology

## 2011-03-20 ENCOUNTER — Encounter (HOSPITAL_COMMUNITY): Payer: Self-pay

## 2011-03-20 ENCOUNTER — Encounter (HOSPITAL_COMMUNITY): Payer: Self-pay | Admitting: Anesthesiology

## 2011-03-20 ENCOUNTER — Encounter (HOSPITAL_COMMUNITY)
Admission: RE | Disposition: A | Discharge status: Home / Self Care | Payer: Self-pay | Source: Ambulatory Visit | Attending: Obstetrics and Gynecology

## 2011-03-20 SURGERY — Surgical Case
Anesthesia: Epidural | Site: Abdomen | Wound class: Clean Contaminated

## 2011-03-20 MED ORDER — KETOROLAC TROMETHAMINE 30 MG/ML IJ SOLN
30.0000 mg | Freq: Four times a day (QID) | INTRAMUSCULAR | Status: AC | PRN
  Administered 2011-03-20: 30 mg via INTRAVENOUS

## 2011-03-20 MED ORDER — DIPHENHYDRAMINE HCL 50 MG/ML IJ SOLN: 12.5000 mg | INTRAMUSCULAR | Status: DC | PRN

## 2011-03-20 MED ORDER — LACTATED RINGERS IV SOLN
INTRAVENOUS | Status: DC | PRN
  Administered 2011-03-20: 05:00:00 via INTRAVENOUS

## 2011-03-20 MED ORDER — CEFAZOLIN SODIUM-DEXTROSE 2-3 GM-% IV SOLR: 2.0000 g | INTRAVENOUS | Status: DC

## 2011-03-20 MED ORDER — OXYTOCIN 20 UNITS IN LACTATED RINGERS INFUSION - SIMPLE
125.0000 mL/h | INTRAVENOUS | Status: AC
  Administered 2011-03-20: 125 mL/h via INTRAVENOUS

## 2011-03-20 MED ORDER — MENTHOL 3 MG MT LOZG: 1.0000 | LOZENGE | OROMUCOSAL | Status: DC | PRN

## 2011-03-20 MED ORDER — PROMETHAZINE HCL 25 MG/ML IJ SOLN
INTRAMUSCULAR | Status: AC
  Filled 2011-03-20: qty 1

## 2011-03-20 MED ORDER — OXYTOCIN 20 UNITS IN LACTATED RINGERS INFUSION - SIMPLE
INTRAVENOUS | Status: DC | PRN
  Administered 2011-03-20: 20 [IU] via INTRAVENOUS

## 2011-03-20 MED ORDER — DIPHENHYDRAMINE HCL 25 MG PO CAPS: 25.0000 mg | ORAL_CAPSULE | Freq: Four times a day (QID) | ORAL | Status: DC | PRN

## 2011-03-20 MED ORDER — FENTANYL CITRATE 0.05 MG/ML IJ SOLN: 25.0000 ug | INTRAMUSCULAR | Status: DC | PRN

## 2011-03-20 MED ORDER — PRENATAL MULTIVITAMIN CH
1.0000 | ORAL_TABLET | Freq: Every day | ORAL | Status: DC
  Administered 2011-03-20 – 2011-03-22 (×3): 1 via ORAL
  Filled 2011-03-20 (×3): qty 1

## 2011-03-20 MED ORDER — MEPERIDINE HCL 25 MG/ML IJ SOLN: 6.2500 mg | INTRAMUSCULAR | Status: DC | PRN

## 2011-03-20 MED ORDER — MEPERIDINE HCL 25 MG/ML IJ SOLN
INTRAMUSCULAR | Status: AC
  Filled 2011-03-20: qty 1

## 2011-03-20 MED ORDER — PHENYLEPHRINE 40 MCG/ML (10ML) SYRINGE FOR IV PUSH (FOR BLOOD PRESSURE SUPPORT)
PREFILLED_SYRINGE | INTRAVENOUS | Status: AC
  Filled 2011-03-20: qty 5

## 2011-03-20 MED ORDER — NALBUPHINE HCL 10 MG/ML IJ SOLN
5.0000 mg | INTRAMUSCULAR | Status: DC | PRN
  Filled 2011-03-20: qty 1

## 2011-03-20 MED ORDER — ZOLPIDEM TARTRATE 5 MG PO TABS
5.0000 mg | ORAL_TABLET | Freq: Every evening | ORAL | Status: DC | PRN
Start: 2011-03-20 — End: 2011-03-22

## 2011-03-20 MED ORDER — ONDANSETRON HCL 4 MG PO TABS
4.0000 mg | ORAL_TABLET | ORAL | Status: DC | PRN
Start: 2011-03-20 — End: 2011-03-22

## 2011-03-20 MED ORDER — DIPHENHYDRAMINE HCL 25 MG PO CAPS: 25.0000 mg | ORAL_CAPSULE | ORAL | Status: DC | PRN

## 2011-03-20 MED ORDER — WITCH HAZEL-GLYCERIN EX PADS: 1.0000 "application " | MEDICATED_PAD | CUTANEOUS | Status: DC | PRN

## 2011-03-20 MED ORDER — MEPERIDINE HCL 25 MG/ML IJ SOLN
INTRAMUSCULAR | Status: DC | PRN
  Administered 2011-03-20: 25 mg via INTRAVENOUS

## 2011-03-20 MED ORDER — ONDANSETRON HCL 4 MG/2ML IJ SOLN: 4.0000 mg | INTRAMUSCULAR | Status: DC | PRN

## 2011-03-20 MED ORDER — METHYLERGONOVINE MALEATE 0.2 MG PO TABS: 0.2000 mg | ORAL_TABLET | ORAL | Status: DC | PRN

## 2011-03-20 MED ORDER — OXYCODONE-ACETAMINOPHEN 5-325 MG PO TABS
1.0000 | ORAL_TABLET | ORAL | Status: DC | PRN
  Administered 2011-03-21 – 2011-03-22 (×3): 1 via ORAL
  Filled 2011-03-20 (×3): qty 1

## 2011-03-20 MED ORDER — ACETAMINOPHEN 325 MG PO TABS: 325.0000 mg | ORAL_TABLET | ORAL | Status: DC | PRN

## 2011-03-20 MED ORDER — MORPHINE SULFATE 0.5 MG/ML IJ SOLN
INTRAMUSCULAR | Status: AC
  Filled 2011-03-20: qty 10

## 2011-03-20 MED ORDER — SODIUM BICARBONATE 8.4 % IV SOLN
INTRAVENOUS | Status: DC | PRN
  Administered 2011-03-20: 15 mL via EPIDURAL

## 2011-03-20 MED ORDER — LANOLIN HYDROUS EX OINT: 1.0000 "application " | TOPICAL_OINTMENT | CUTANEOUS | Status: DC | PRN

## 2011-03-20 MED ORDER — METOCLOPRAMIDE HCL 5 MG/ML IJ SOLN
INTRAMUSCULAR | Status: AC
  Filled 2011-03-20: qty 2

## 2011-03-20 MED ORDER — IBUPROFEN 600 MG PO TABS
600.0000 mg | ORAL_TABLET | Freq: Four times a day (QID) | ORAL | Status: DC
  Administered 2011-03-20 – 2011-03-22 (×8): 600 mg via ORAL
  Filled 2011-03-20 (×6): qty 1

## 2011-03-20 MED ORDER — PHENYLEPHRINE 40 MCG/ML (10ML) SYRINGE FOR IV PUSH (FOR BLOOD PRESSURE SUPPORT)
PREFILLED_SYRINGE | INTRAVENOUS | Status: AC
  Filled 2011-03-20: qty 10

## 2011-03-20 MED ORDER — SCOPOLAMINE 1 MG/3DAYS TD PT72
MEDICATED_PATCH | TRANSDERMAL | Status: AC
  Filled 2011-03-20: qty 1

## 2011-03-20 MED ORDER — SENNOSIDES-DOCUSATE SODIUM 8.6-50 MG PO TABS
2.0000 | ORAL_TABLET | Freq: Every day | ORAL | Status: DC
  Administered 2011-03-20 – 2011-03-21 (×2): 2 via ORAL

## 2011-03-20 MED ORDER — ONDANSETRON HCL 4 MG/2ML IJ SOLN: 4.0000 mg | Freq: Three times a day (TID) | INTRAMUSCULAR | Status: DC | PRN

## 2011-03-20 MED ORDER — CEFAZOLIN SODIUM 1-5 GM-% IV SOLN
INTRAVENOUS | Status: DC | PRN
  Administered 2011-03-20: 2 g via INTRAVENOUS

## 2011-03-20 MED ORDER — SIMETHICONE 80 MG PO CHEW
80.0000 mg | CHEWABLE_TABLET | Freq: Three times a day (TID) | ORAL | Status: DC
  Administered 2011-03-20 – 2011-03-22 (×7): 80 mg via ORAL

## 2011-03-20 MED ORDER — ACETAMINOPHEN 10 MG/ML IV SOLN
1000.0000 mg | Freq: Four times a day (QID) | INTRAVENOUS | Status: AC | PRN
  Filled 2011-03-20: qty 100

## 2011-03-20 MED ORDER — LACTATED RINGERS IV SOLN
INTRAVENOUS | Status: DC
  Administered 2011-03-20: 15:00:00 via INTRAVENOUS

## 2011-03-20 MED ORDER — TETANUS-DIPHTH-ACELL PERTUSSIS 5-2.5-18.5 LF-MCG/0.5 IM SUSP
0.5000 mL | Freq: Once | INTRAMUSCULAR | Status: AC
  Administered 2011-03-21: 0.5 mL via INTRAMUSCULAR
  Filled 2011-03-20: qty 0.5

## 2011-03-20 MED ORDER — DIPHENHYDRAMINE HCL 50 MG/ML IJ SOLN: 25.0000 mg | INTRAMUSCULAR | Status: DC | PRN

## 2011-03-20 MED ORDER — LIDOCAINE-EPINEPHRINE (PF) 2 %-1:200000 IJ SOLN
INTRAMUSCULAR | Status: AC
  Filled 2011-03-20: qty 20

## 2011-03-20 MED ORDER — DIBUCAINE 1 % RE OINT: 1.0000 "application " | TOPICAL_OINTMENT | RECTAL | Status: DC | PRN

## 2011-03-20 MED ORDER — NALOXONE HCL 0.4 MG/ML IJ SOLN: 0.4000 mg | INTRAMUSCULAR | Status: DC | PRN

## 2011-03-20 MED ORDER — OXYTOCIN 20 UNITS IN LACTATED RINGERS INFUSION - SIMPLE
INTRAVENOUS | Status: AC
  Administered 2011-03-20: 125 mL/h via INTRAVENOUS
  Filled 2011-03-20: qty 1000

## 2011-03-20 MED ORDER — PHENYLEPHRINE HCL 10 MG/ML IJ SOLN
INTRAMUSCULAR | Status: DC | PRN
  Administered 2011-03-20 (×3): 40 ug via INTRAVENOUS

## 2011-03-20 MED ORDER — DEXAMETHASONE SODIUM PHOSPHATE 10 MG/ML IJ SOLN
INTRAMUSCULAR | Status: AC
  Filled 2011-03-20: qty 1

## 2011-03-20 MED ORDER — IBUPROFEN 600 MG PO TABS
600.0000 mg | ORAL_TABLET | Freq: Four times a day (QID) | ORAL | Status: DC | PRN
  Filled 2011-03-20 (×2): qty 1

## 2011-03-20 MED ORDER — ONDANSETRON HCL 4 MG/2ML IJ SOLN
INTRAMUSCULAR | Status: AC
  Filled 2011-03-20: qty 2

## 2011-03-20 MED ORDER — KETOROLAC TROMETHAMINE 30 MG/ML IJ SOLN: 30.0000 mg | Freq: Four times a day (QID) | INTRAMUSCULAR | Status: AC | PRN

## 2011-03-20 MED ORDER — 0.9 % SODIUM CHLORIDE (POUR BTL) OPTIME
TOPICAL | Status: DC | PRN
  Administered 2011-03-20: 700 mL

## 2011-03-20 MED ORDER — DEXAMETHASONE SODIUM PHOSPHATE 10 MG/ML IJ SOLN
INTRAMUSCULAR | Status: DC | PRN
  Administered 2011-03-20: 10 mg via INTRAVENOUS

## 2011-03-20 MED ORDER — SCOPOLAMINE 1 MG/3DAYS TD PT72
1.0000 | MEDICATED_PATCH | Freq: Once | TRANSDERMAL | Status: DC
  Administered 2011-03-20: 1.5 mg via TRANSDERMAL

## 2011-03-20 MED ORDER — SIMETHICONE 80 MG PO CHEW: 80.0000 mg | CHEWABLE_TABLET | ORAL | Status: DC | PRN

## 2011-03-20 MED ORDER — ONDANSETRON HCL 4 MG/2ML IJ SOLN
INTRAMUSCULAR | Status: DC | PRN
  Administered 2011-03-20: 4 mg via INTRAVENOUS

## 2011-03-20 MED ORDER — METOCLOPRAMIDE HCL 5 MG/ML IJ SOLN: 10.0000 mg | Freq: Three times a day (TID) | INTRAMUSCULAR | Status: DC | PRN

## 2011-03-20 MED ORDER — KETOROLAC TROMETHAMINE 30 MG/ML IJ SOLN
INTRAMUSCULAR | Status: AC
  Filled 2011-03-20: qty 1

## 2011-03-20 MED ORDER — OXYTOCIN 10 UNIT/ML IJ SOLN
INTRAMUSCULAR | Status: AC
  Filled 2011-03-20: qty 2

## 2011-03-20 MED ORDER — PROMETHAZINE HCL 25 MG/ML IJ SOLN
6.2500 mg | INTRAMUSCULAR | Status: DC | PRN
  Administered 2011-03-20: 6.25 mg via INTRAVENOUS

## 2011-03-20 MED ORDER — SODIUM CHLORIDE 0.9 % IV SOLN
1.0000 ug/kg/h | INTRAVENOUS | Status: DC | PRN
  Filled 2011-03-20: qty 2.5

## 2011-03-20 MED ORDER — SODIUM BICARBONATE 8.4 % IV SOLN
INTRAVENOUS | Status: AC
  Filled 2011-03-20: qty 50

## 2011-03-20 MED ORDER — CEFAZOLIN SODIUM 1-5 GM-% IV SOLN
INTRAVENOUS | Status: AC
  Filled 2011-03-20: qty 100

## 2011-03-20 MED ORDER — METHYLERGONOVINE MALEATE 0.2 MG/ML IJ SOLN: 0.2000 mg | INTRAMUSCULAR | Status: DC | PRN

## 2011-03-20 MED ORDER — METOCLOPRAMIDE HCL 5 MG/ML IJ SOLN
INTRAMUSCULAR | Status: DC | PRN
  Administered 2011-03-20: 10 mg via INTRAVENOUS

## 2011-03-20 MED ORDER — SODIUM CHLORIDE 0.9 % IJ SOLN: 3.0000 mL | INTRAMUSCULAR | Status: DC | PRN

## 2011-03-20 MED ORDER — MORPHINE SULFATE (PF) 0.5 MG/ML IJ SOLN
INTRAMUSCULAR | Status: DC | PRN
  Administered 2011-03-20: 4 mg via EPIDURAL
  Administered 2011-03-20: 1 mg via INTRAVENOUS

## 2011-03-20 SURGICAL SUPPLY — 32 items
ADH SKN CLS APL DERMABOND .7 (GAUZE/BANDAGES/DRESSINGS) ×1
CHLORAPREP W/TINT 26ML (MISCELLANEOUS) ×2 IMPLANT
CLOTH BEACON ORANGE TIMEOUT ST (SAFETY) ×2 IMPLANT
DERMABOND ADVANCED (GAUZE/BANDAGES/DRESSINGS) ×1
DERMABOND ADVANCED .7 DNX12 (GAUZE/BANDAGES/DRESSINGS) IMPLANT
DRESSING TELFA 8X3 (GAUZE/BANDAGES/DRESSINGS) ×1 IMPLANT
ELECT REM PT RETURN 9FT ADLT (ELECTROSURGICAL) ×4
ELECTRODE REM PT RTRN 9FT ADLT (ELECTROSURGICAL) ×1 IMPLANT
EXTRACTOR VACUUM M CUP 4 TUBE (SUCTIONS) IMPLANT
GAUZE SPONGE 4X4 12PLY STRL LF (GAUZE/BANDAGES/DRESSINGS) ×2 IMPLANT
GLOVE BIO SURGEON STRL SZ7 (GLOVE) ×4 IMPLANT
GOWN PREVENTION PLUS LG XLONG (DISPOSABLE) ×4 IMPLANT
KIT ABG SYR 3ML LUER SLIP (SYRINGE) IMPLANT
NDL HYPO 25X5/8 SAFETYGLIDE (NEEDLE) IMPLANT
NEEDLE HYPO 25X5/8 SAFETYGLIDE (NEEDLE) IMPLANT
NS IRRIG 1000ML POUR BTL (IV SOLUTION) ×2 IMPLANT
PACK C SECTION WH (CUSTOM PROCEDURE TRAY) ×2 IMPLANT
PAD ABD 7.5X8 STRL (GAUZE/BANDAGES/DRESSINGS) ×1 IMPLANT
RTRCTR C-SECT PINK 25CM LRG (MISCELLANEOUS) ×1 IMPLANT
RTRCTR C-SECT PINK 34CM XLRG (MISCELLANEOUS) IMPLANT
SLEEVE SCD COMPRESS KNEE MED (MISCELLANEOUS) ×1 IMPLANT
SPONGE LAP 18X18 X RAY DECT (DISPOSABLE) ×1 IMPLANT
STAPLER VISISTAT 35W (STAPLE) IMPLANT
SUT CHROMIC 1 CTX 36 (SUTURE) ×4 IMPLANT
SUT CHROMIC 2 0 CT 1 (SUTURE) ×1 IMPLANT
SUT PDS AB 0 CTX 60 (SUTURE) ×2 IMPLANT
SUT VIC AB 2-0 CT1 27 (SUTURE) ×2
SUT VIC AB 2-0 CT1 TAPERPNT 27 (SUTURE) ×1 IMPLANT
SUT VIC AB 4-0 KS 27 (SUTURE) ×1 IMPLANT
TOWEL OR 17X24 6PK STRL BLUE (TOWEL DISPOSABLE) ×4 IMPLANT
TRAY FOLEY CATH 14FR (SET/KITS/TRAYS/PACK) ×1 IMPLANT
WATER STERILE IRR 1000ML POUR (IV SOLUTION) ×2 IMPLANT

## 2011-03-20 NOTE — Anesthesia Postprocedure Evaluation (Signed)
Anesthesia Post Note  Patient: Destiny [name]  Procedure(s) Performed:  CESAREAN SECTION - primary cesarean section of baby boy at 80  APGAR 9/9  Anesthesia type: Epidural  Patient location: PACU  Post pain: Pain level controlled  Post assessment: Post-op Vital signs reviewed  Last Vitals:  Filed Vitals:   03/20/11 0615  BP: 100/69  Pulse: 88  Temp:   Resp: 19    Post vital signs: Reviewed  Level of consciousness: awake  Complications: No apparent anesthesia complications

## 2011-03-20 NOTE — Anesthesia Postprocedure Evaluation (Signed)
  Anesthesia Post-op Note  Patient: Destiny [name]  Procedure(s) Performed:  CESAREAN SECTION - primary cesarean section of baby boy at 53  APGAR 9/9  Patient Location: Mother/Baby  Anesthesia Type: Epidural  Level of Consciousness: awake, alert  and oriented  Airway and Oxygen Therapy: Patient Spontanous Breathing  Post-op Pain: mild  Post-op Assessment: Patient's Cardiovascular Status Stable, Respiratory Function Stable, Patent Airway, No signs of Nausea or vomiting and Pain level controlled  Post-op Vital Signs: stable  Complications: No apparent anesthesia complications

## 2011-03-20 NOTE — Transfer of Care (Signed)
Immediate Anesthesia Transfer of Care Note  Patient: Destiny [name]  Procedure(s) Performed:  CESAREAN SECTION - primary cesarean section of baby boy at 63  APGAR 9/9  Patient Location: PACU  Anesthesia Type: Epidural  Level of Consciousness: awake, alert , oriented and patient cooperative  Airway & Oxygen Therapy: Patient Spontanous Breathing  Post-op Assessment: Report given to PACU RN and Post -op Vital signs reviewed and stable  Post vital signs: Reviewed and stable  Complications: No apparent anesthesia complications

## 2011-03-20 NOTE — Consult Note (Signed)
Called to attend primary C/S for arrested descent with no other risk factors.  At delivery infant in vertex with loose nuchal cord reduced easily. Spontaneous cries and active tone to observation. Given tactile stimulation with bulb suction of naso/oropharynx yielding minimal fluid. No dysmorphic features.   Shown to parents and infant's care transferred to [facility] and to assigned pediatrician.    Dagoberto Ligas MD [facility]

## 2011-03-20 NOTE — Brief Op Note (Signed)
03/18/2011 - 03/20/2011  5:56 AM  PATIENT:  Carlyon Shadow Wendland  41 y.o. female  PRE-OPERATIVE DIAGNOSIS:  arrest of descent  POST-OPERATIVE DIAGNOSIS:  arrest of descent  PROCEDURE:  Procedure(s): CESAREAN SECTION  SURGEON:  Surgeon(s): Keyonta Barradas H. Tenny Craw, MD  PHYSICIAN ASSISTANT:   ASSISTANTS: none   ANESTHESIA:   epidural  EBL:  Total I/O In: -  Out: 600 [Urine:600]  BLOOD ADMINISTERED:none  DRAINS: Urinary Catheter (Foley)   LOCAL MEDICATIONS USED:  NONE  SPECIMEN:  Placenta  DISPOSITION OF SPECIMEN:  L&D  COUNTS:  YES  TOURNIQUET:  * No tourniquets in log *  DICTATION: .Dragon Dictation  PLAN OF CARE: Admit to inpatient   PATIENT DISPOSITION:  PACU - hemodynamically stable.   Delay start of Pharmacological VTE agent (>24hrs) due to surgical blood loss or risk of bleeding:  {YES/NO/NOT APPLICABLE:20182

## 2011-03-20 NOTE — Progress Notes (Signed)
Pt is not feeling any pressure at all & barely feels the vag exam.  Will labor down until pt feels pressure. Will reassess in 30 minutes.

## 2011-03-20 NOTE — Op Note (Signed)
Pre-Operative Diagnosis: 1) 40+6 week intrauterine pregnancy 2) advance maternal age 40) failure to descend Postoperative Diagnosis: same Procedure: primary low transverse cesarean section via Pfannenstiel skin incision Surgeon: Dr. Waynard Reeds Assistant: none Operative Findings: Vigorous female infant in the occiput posterior presentation weighing 9 lbs. 10 oz. With Apgar scores of 9 at 1 minute and 9 at 5 minutes. Normal appearing ovaries tubes and uterus Specimen: placenta for disposal EBL:800 cc  Procedure:[name] is an 41 year old gravida 1 para 0 at 40 weeks and 6 days estimated gestational age who presents for cesarean section. The patient was admitted for postdates induction on 03/18/2011. She made it to complete and pushing for 2 hour however there was failure of the fetal head to descend and the decision was made to proceed with cesarean section for failure to descend. Following the appropriate informed consent the patient was brought to the operating room where epidural anesthesia was administered and found to be adequate. She was placed in the dorsal supine position with a leftward tilt. She was prepped and draped in the normal sterile fashion. Scalpel was then used to make a Pfannenstiel skin incision which was carried down to the underlying layers of soft tissue to the fascia. The fascia was incised in the midline and the fascial incision was extended laterally with Mayo scissors. The superior aspect of the fascial incision was grasped with Coker clamps x2 Tented up and the rectus muscles dissected off sharply with the electrocautery unit area and the same procedure was repeated on the inferior aspect of the fascial incision. The rectus muscles were separated in the midline. The abdominal peritoneum was identified, tented up, entered sharply, and the incision was extended superiorly and inferiorly with good visualization of the bladder. The a Alexis retractor was then deployed. The vesicouterine  peritoneum was identified, tented up, entered sharply, and the bladder flap was created digitally. Scalpel was then used to make a low transverse incision on the uterus which was extended laterally with blunt dissection. The fetal vertex was identified, delivered easily through the uterine incision followed by the body. The infant was bulb suctioned on the operative field cried vigorously, cord was clamped and cut and the infant was passed to the waiting neonatologist. Placenta was then delivered spontaneously, the uterus was cleared of all clot and debris. The uterine incision was repaired with #1 chromic in running locked fashion followed by a second imbricating layer. Ovaries and tubes were inspected and normal. The Alexis retractor was removed. The uterus was returned to the abdominal cavity the abdominal cavity was cleared of all clot and debris. The abdominal peritoneum was reapproximated with 2-0 Vicryl in a running fashion, the rectus muscles was reapproximated with 2-0 chromic in a running fashion. The fascia was closed with 0-looped PDS in a running fashion. The skin was closed with subcuticular closure and Dermabond. All sponge lap and needle counts were correct x2. Patient tolerated the procedure well and recovered in stable condition following the procedure.

## 2011-03-20 NOTE — OR Nursing (Signed)
Fundal massage per DLWegner RN 

## 2011-03-20 NOTE — Addendum Note (Signed)
Addendum  created 03/20/11 1223 by Lincoln Brigham, CRNA   Modules edited:Notes Section

## 2011-03-21 LAB — CBC
Hemoglobin: 9.1 g/dL — ABNORMAL LOW (ref 12.0–15.0)
RBC: 2.88 MIL/uL — ABNORMAL LOW (ref 3.87–5.11)

## 2011-03-21 NOTE — Progress Notes (Signed)
Subjective: Postpartum Day 1: Cesarean Delivery Patient reports incisional pain and tolerating PO.    Objective: Vital signs in last 24 hours: Temp:  [97.6 F (36.4 C)-98.4 F (36.9 C)] 97.9 F (36.6 C) (01/20 0549) Pulse Rate:  [77-91] 82  (01/20 0549) Resp:  [16-18] 18  (01/20 0549) BP: (110-137)/(60-82) 110/70 mmHg (01/20 0549) SpO2:  [94 %-97 %] 94 % (01/20 0549)  Physical Exam:  General: alert, cooperative and appears stated age Lochia: appropriate Uterine Fundus: firm Incision: dressing C/D/I DVT Evaluation: No evidence of DVT seen on physical exam.   Basename 03/21/11 0500 03/18/11 2052  HGB 9.1* 11.9*  HCT 26.2* 35.0*    Assessment/Plan: Status post Cesarean section. Doing well postoperatively.  Continue current care. Discussed neonatal circ, R/B/A reveiwed.  Will proceed with circ  Oberon Hehir H. 03/21/2011, 10:08 AM

## 2011-03-22 MED ORDER — OXYCODONE-ACETAMINOPHEN 5-325 MG PO TABS: 1.0000 | ORAL_TABLET | ORAL | Status: AC | PRN

## 2011-03-22 MED ORDER — IBUPROFEN 600 MG PO TABS: 600.0000 mg | ORAL_TABLET | Freq: Four times a day (QID) | ORAL | Status: AC

## 2011-03-22 MED ORDER — SENNOSIDES-DOCUSATE SODIUM 8.6-50 MG PO TABS: 2.0000 | ORAL_TABLET | Freq: Every day | ORAL | Status: AC

## 2011-03-22 NOTE — Discharge Summary (Signed)
Obstetric Discharge Summary Reason for Admission: induction of labor Prenatal Procedures: none Intrapartum Procedures: cesarean: low cervical, transverse Postpartum Procedures: none Complications-Operative and Postpartum: none Hemoglobin  Date Value Range Status  03/21/2011 9.1* 12.0-15.0 (g/dL) Final     HCT  Date Value Range Status  03/21/2011 26.2* 36.0-46.0 (%) Final    Discharge Diagnoses: Term Pregnancy-delivered  Discharge Information: Date: 03/22/2011 Activity: see instructions Diet: routine Medications: PNV, Ibuprofen, Colace, Iron and Percocet Condition: stable Instructions: refer to practice specific booklet Discharge to: home Follow-up Information    Follow up with Almon Hercules., MD in 4 weeks.   Contact information:   [address] [place] [address] [phone]          Newborn Data: Live born female  Birth Weight: 9 lb 10.2 oz (4370 g) APGAR: 9, 9  Home with mother.  Philip Aspen 03/22/2011, 9:09 AM

## 2011-03-23 ENCOUNTER — Encounter (HOSPITAL_COMMUNITY): Payer: Self-pay | Admitting: Obstetrics and Gynecology

## 2011-03-25 ENCOUNTER — Ambulatory Visit (HOSPITAL_COMMUNITY)
Admission: RE | Admit: 2011-03-25 | Discharge: 2011-03-25 | Disposition: A | Discharge status: Home / Self Care | Payer: Federal, State, Local not specified - PPO | Source: Ambulatory Visit | Attending: Obstetrics and Gynecology | Admitting: Obstetrics and Gynecology

## 2011-03-25 NOTE — Progress Notes (Signed)
Adult Lactation Consultation Outpatient Visit Note  Patient Name: Destiny [name]        Baby boy [name]" Gad, DOB [birth year], Birth Weight  9 lb 10 oz  Baby is 80 days old today. Date of Birth: August 28, 1970 Gestational Age at Delivery: [redacted]w[redacted]d Type of Delivery: C/s  Breastfeeding History: Frequency of Breastfeeding: every 2 hours or more frequent Length of Feeding: 15-25 minutes Voids: 6-8 Stools: 6-8 yellow  Supplementing / Method:   No supplements at this time Pumping:  Type of Pump:   Frequency:  Volume:    Comments: Mom is here c/o or sore nipples. Concerned if baby is latching well. On exam bilateral nipples are red, excoriated with evidence of cracking that is scabbing over. No bleeding observed, none with feeding assessment at this visit.    Consultation Evaluation:  Initial Feeding Assessment: Pre-feed Weight:  9 lb 8.4 oz   4322 gm Post-feed Weight:  9lb 10.1 oz   4368 gm Amount Transferred:  46 ml  With nursing for 15 minutes Comments: Baby latched easily to right breast, like to keep bottom lip tucked in. Demonstrated to mom how to bring bottom lip down, she reports this felt much better. Baby has a clicking sound off and on that resolved with keeping baby closer to the breast. Intermittent dimpling noted that resolved with keeping baby closer to the breast. Lots of audible swallows with feeding.  Additional Feeding Assessment: Pre-feed Weight:  9 lb 10.1 oz  4368 gm Post-feed Weight:  9 lb 10.5 oz  4380 gm Amount Transferred:  12 ml with nursing 5 minutes Comments:  Baby latched easily to left breast but also tucks in bottom lip. Mom demonstrated how to bring bottom lip down opening mouth wider and mom reported less discomfort with nursing. Baby has a recessed bottom lip and discussed this may be contributing to discomfort.   Additional Feeding Assessment: Pre-feed Weight: Post-feed Weight: Amount Transferred: Comments:  Total Breast milk Transferred this  Visit: 51 ml Total Supplement Given: None  Additional Interventions: Gave mom comfort gels for nipple tenderness. Stressed importance of keeping baby close with feedings and keeping bottom lip down, use good support. She did report less discomfort with nursing when she made these adjustments. Mom was concerned about baby pulling back on the breast in the middle of feedings. Advised mom to take baby off the breast and burp the baby when he starts pulling back. He is very efficient at milk transfer and would cough sometimes with nursing at this visit. Explained to mom she may have a strong milk ejection which is causing this to happen. Mom reported reassurance after the visit with ways to improve latch to prevent further soreness and with the baby's weight gain.   Follow-Up  Prn.    Alfred Levins 03/25/2011, 1:53 PM

## 2011-05-30 ENCOUNTER — Inpatient Hospital Stay (HOSPITAL_COMMUNITY)
Admission: AD | Admit: 2011-05-30 | Payer: BC Managed Care – PPO | Source: Ambulatory Visit | Admitting: Obstetrics & Gynecology

## 2012-12-26 ENCOUNTER — Other Ambulatory Visit: Payer: Self-pay

## 2012-12-26 DIAGNOSIS — Z1231 Encounter for screening mammogram for malignant neoplasm of breast: Secondary | ICD-10-CM

## 2013-01-23 ENCOUNTER — Ambulatory Visit
Admission: RE | Admit: 2013-01-23 | Discharge: 2013-01-23 | Disposition: A | Discharge status: Home / Self Care | Payer: Federal, State, Local not specified - PPO | Source: Ambulatory Visit

## 2013-01-23 DIAGNOSIS — Z1231 Encounter for screening mammogram for malignant neoplasm of breast: Secondary | ICD-10-CM

## 2013-06-20 ENCOUNTER — Inpatient Hospital Stay (HOSPITAL_COMMUNITY)
Admission: AD | Admit: 2013-06-20 | Discharge: 2013-06-20 | Disposition: A | Discharge status: Home / Self Care | Payer: Federal, State, Local not specified - PPO | Source: Ambulatory Visit | Attending: Obstetrics and Gynecology | Admitting: Obstetrics and Gynecology

## 2013-06-20 ENCOUNTER — Inpatient Hospital Stay (HOSPITAL_COMMUNITY): Payer: Federal, State, Local not specified - PPO

## 2013-06-20 ENCOUNTER — Encounter (HOSPITAL_COMMUNITY): Payer: Self-pay

## 2013-06-20 DIAGNOSIS — I959 Hypotension, unspecified: Secondary | ICD-10-CM | POA: Insufficient documentation

## 2013-06-20 DIAGNOSIS — O98519 Other viral diseases complicating pregnancy, unspecified trimester: Secondary | ICD-10-CM | POA: Insufficient documentation

## 2013-06-20 DIAGNOSIS — O039 Complete or unspecified spontaneous abortion without complication: Secondary | ICD-10-CM

## 2013-06-20 DIAGNOSIS — O021 Missed abortion: Secondary | ICD-10-CM | POA: Insufficient documentation

## 2013-06-20 DIAGNOSIS — A6000 Herpesviral infection of urogenital system, unspecified: Secondary | ICD-10-CM | POA: Insufficient documentation

## 2013-06-20 DIAGNOSIS — I499 Cardiac arrhythmia, unspecified: Secondary | ICD-10-CM | POA: Insufficient documentation

## 2013-06-20 DIAGNOSIS — O09529 Supervision of elderly multigravida, unspecified trimester: Secondary | ICD-10-CM | POA: Insufficient documentation

## 2013-06-20 DIAGNOSIS — J45909 Unspecified asthma, uncomplicated: Secondary | ICD-10-CM | POA: Insufficient documentation

## 2013-06-20 LAB — CBC
HCT: 40.6 % (ref 36.0–46.0)
Hemoglobin: 13.9 g/dL (ref 12.0–15.0)
MCH: 31.7 pg (ref 26.0–34.0)
MCHC: 34.2 g/dL (ref 30.0–36.0)
MCV: 92.5 fL (ref 78.0–100.0)
PLATELETS: 252 10*3/uL (ref 150–400)
RBC: 4.39 MIL/uL (ref 3.87–5.11)
RDW: 12.4 % (ref 11.5–15.5)
WBC: 8.6 10*3/uL (ref 4.0–10.5)

## 2013-06-20 LAB — POCT PREGNANCY, URINE: Preg Test, Ur: POSITIVE — AB

## 2013-06-20 LAB — URINALYSIS, ROUTINE W REFLEX MICROSCOPIC
Bilirubin Urine: NEGATIVE
Glucose, UA: NEGATIVE mg/dL
Ketones, ur: NEGATIVE mg/dL
Leukocytes, UA: NEGATIVE
NITRITE: NEGATIVE
PROTEIN: NEGATIVE mg/dL
Specific Gravity, Urine: 1.015 (ref 1.005–1.030)
UROBILINOGEN UA: 0.2 mg/dL (ref 0.0–1.0)
pH: 6.5 (ref 5.0–8.0)

## 2013-06-20 LAB — URINE MICROSCOPIC-ADD ON

## 2013-06-20 LAB — HCG, QUANTITATIVE, PREGNANCY: HCG, BETA CHAIN, QUANT, S: 6241 m[IU]/mL — AB (ref <5)

## 2013-06-20 MED ORDER — OXYCODONE-ACETAMINOPHEN 5-325 MG PO TABS: 2.0000 | ORAL_TABLET | ORAL | Status: DC | PRN

## 2013-06-20 NOTE — Discharge Instructions (Signed)
Miscarriage  A miscarriage is the loss of an unborn baby (fetus) before the 20th week of pregnancy. The cause is often unknown.   HOME CARE  · You may need to stay in bed (bed rest), or you may be able to do light activity. Go about activity as told by your doctor.  · Have help at home.  · Write down how many pads you use each day. Write down how soaked they are.  · Do not use tampons. Do not wash out your vagina (douche) or have sex (intercourse) until your doctor approves.  · Only take medicine as told by your doctor.  · Do not take aspirin.  · Keep all doctor visits as told.  · If you or your partner have problems with grieving, talk to your doctor. You can also try counseling. Give yourself time to grieve before trying to get pregnant again.  GET HELP RIGHT AWAY IF:  · You have bad cramps or pain in your back or belly (abdomen).  · You have a fever.  · You pass large clumps of blood (clots) from your vagina that are walnut-sized or larger. Save the clumps for your doctor to see.  · You pass large amounts of tissue from your vagina. Save the tissue for your doctor to see.  · You have more bleeding.  · You have thick, bad-smelling fluid (discharge) coming from the vagina.  · You get lightheaded, weak, or you pass out (faint).  · You have chills.  MAKE SURE YOU:  · Understand these instructions.  · Will watch your condition.  · Will get help right away if you are not doing well or get worse.  Document Released: 05/10/2011 Document Reviewed: 05/10/2011  [employer]® Patient Information ©2014 [employer].

## 2013-06-20 NOTE — MAU Note (Signed)
Patient states she started spotting since 4-20 that has now become dark red and more. States she has mild abdominal tenderness. Denies vomiting, nausea.

## 2013-06-20 NOTE — MAU Provider Note (Signed)
History     CSN: [id number]  Arrival date and time: 06/20/13 [id number]   First Provider Initiated Contact with Patient 06/20/13 1010      Chief Complaint  Patient presents with  . Vaginal Bleeding   HPI Comments: Destiny [name] 43 y.o. 2463w4d G2P1001 presents to MAU with vaginal bleeding since 4/20 that started with spotting. She has some mild cramping as well. She called Office and was advised to come to MAU.  Vaginal Bleeding      Past Medical History  Diagnosis Date  . AMA (advanced maternal age) multigravida 35+   . Herpes   . 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 Asthma     exercise induced  . Hypotension     Past Surgical History  Procedure Laterality Date  . Hand surgery    . Nasal sinus surgery  2009  . Cesarean section  03/20/2011    Procedure: CESAREAN SECTION;  Surgeon: Freddrick MarchKendra H. Tenny Crawoss, MD;  Location: [facility];  Service: Gynecology;  Laterality: N/A;  primary cesarean section of baby boy at [id number]  APGAR 9/9    Family History  Problem Relation Age of Onset  . Thyroid disease Mother   . Cancer Mother     breast  . Liver disease Mother     autoimmune hepatitis  . Hypertension Father   . Heart disease Father   . Hyperlipidemia Father   . Heart disease Paternal Grandfather   . Hypertension Paternal Grandfather   . Stroke Maternal Aunt   . Dysrhythmia Maternal Aunt   . Cancer Paternal Grandmother     lung    History  Substance Use Topics  . Smoking status: Never Smoker   . Smokeless tobacco: Never Used  . Alcohol Use: No    Allergies:  Allergies  Allergen Reactions  . Influenza Vaccines Hives    Water blisters on face and hives on chest and stomach and told not to get again    Prescriptions prior to admission  Medication Sig Dispense Refill  . Prenatal Vit-Fe Fumarate-FA (PRENATAL MULTIVITAMIN) TABS Take 1 tablet by mouth daily. DHA      . PROAIR HFA 108 (90 BASE) MCG/ACT inhaler Inhale 1 puff into the lungs as needed.        Review of Systems   Constitutional: Negative.   HENT: Negative.   Genitourinary: Positive for vaginal bleeding.       Cramping and vaginal bleeding  Skin: Negative.    Physical Exam   Blood pressure 119/54, pulse 87, temperature 98.2 F (36.8 C), temperature source Oral, resp. rate 16, height 5\' 5"  (1.651 m), weight 72.757 kg (160 lb 6.4 oz), last menstrual period 04/21/2013, SpO2 99.00%, unknown if currently breastfeeding.  Physical Exam  Constitutional: She is oriented to person, place, and time. She appears well-developed and well-nourished. No distress.  HENT:  Head: Normocephalic and atraumatic.  Eyes: Pupils are equal, round, and reactive to light.  Genitourinary:  Genital:External: bloody Vaginal:Moderate amount vaginal blood with clots Cervix:dilated 1 cm Bimanual:slightly tender   Neurological: She is alert and oriented to person, place, and time.  Skin: Skin is warm and dry.  Psychiatric: She has a normal mood and affect. Her behavior is normal. Judgment and thought content normal.   Blood Type B+   Results for orders placed during the hospital encounter of 06/20/13 (from the past 24 hour(s))  URINALYSIS, ROUTINE W REFLEX MICROSCOPIC     Status: Abnormal   Collection Time  06/20/13  9:49 AM      Result Value Ref Range   Color, Urine YELLOW  YELLOW   APPearance HAZY (*) CLEAR   Specific Gravity, Urine 1.015  1.005 - 1.030   pH 6.5  5.0 - 8.0   Glucose, UA NEGATIVE  NEGATIVE mg/dL   Hgb urine dipstick LARGE (*) NEGATIVE   Bilirubin Urine NEGATIVE  NEGATIVE   Ketones, ur NEGATIVE  NEGATIVE mg/dL   Protein, ur NEGATIVE  NEGATIVE mg/dL   Urobilinogen, UA 0.2  0.0 - 1.0 mg/dL   Nitrite NEGATIVE  NEGATIVE   Leukocytes, UA NEGATIVE  NEGATIVE  URINE MICROSCOPIC-ADD ON     Status: None   Collection Time    06/20/13  9:49 AM      Result Value Ref Range   Squamous Epithelial / LPF RARE  RARE   RBC / HPF 11-20  <3 RBC/hpf   Bacteria, UA RARE  RARE  CBC     Status: None   Collection  Time    06/20/13 10:20 AM      Result Value Ref Range   WBC 8.6  4.0 - 10.5 K/uL   RBC 4.39  3.87 - 5.11 MIL/uL   Hemoglobin 13.9  12.0 - 15.0 g/dL   HCT 40.9  81.1 - 91.4 %   MCV 92.5  78.0 - 100.0 fL   MCH 31.7  26.0 - 34.0 pg   MCHC 34.2  30.0 - 36.0 g/dL   RDW 78.2  95.6 - 21.3 %   Platelets 252  150 - 400 K/uL  HCG, QUANTITATIVE, PREGNANCY     Status: Abnormal   Collection Time    06/20/13 10:20 AM      Result Value Ref Range   hCG, Beta Chain, Quant, S 6241 (*) <5 mIU/mL  POCT PREGNANCY, URINE     Status: Abnormal   Collection Time    06/20/13 10:28 AM      Result Value Ref Range   Preg Test, Ur POSITIVE (*) NEGATIVE   US Ob Comp Less 14 Wks  06/20/2013   CLINICAL DATA:  Vaginal bleeding  EXAM: OBSTETRIC <14 WK ULTRASOUND  TECHNIQUE: Transabdominal ultrasound was performed for evaluation of the gestation as well as the maternal uterus and adnexal regions.  COMPARISON:  None.  FINDINGS: Intrauterine gestational sac: Not visualized.  Yolk sac:  None.  Embryo:  None.  Cardiac Activity: None.  Maternal uterus/adnexae: The uterus is normal in size and echogenicity. The endometrium is heterogeneous in appearance 10 mm. There is hypoechoic material within the endometrial cavity likely reflecting blood products. The right ovary measures 3.3 x 2.2 x 1.3 cm. The left ovary measures 4.4 x 2.7 x 3.1 cm. There is a 3.2 x 2.5 x 2.6 cm anechoic left ovarian mass most consistent with a cyst. There is no pelvic free fluid.  IMPRESSION: 1. No intrauterine pregnancy identified. Findings meet definitive criteria for failed pregnancy. This follows SRU consensus guidelines: Diagnostic Criteria for Nonviable Pregnancy Early in the First Trimester. Macy Mis J Med [phone]. 2. Heterogeneous endometrium with hypoechoic material within the endometrial cavity likely reflecting blood products.   Electronically Signed   By: Elige Ko   On: 06/20/2013 11:39   US Ob Transvaginal  06/20/2013   CLINICAL  DATA:  Vaginal bleeding  EXAM: OBSTETRIC <14 WK ULTRASOUND  TECHNIQUE: Transabdominal ultrasound was performed for evaluation of the gestation as well as the maternal uterus and adnexal regions.  COMPARISON:  None.  FINDINGS: Intrauterine  gestational sac: Not visualized.  Yolk sac:  None.  Embryo:  None.  Cardiac Activity: None.  Maternal uterus/adnexae: The uterus is normal in size and echogenicity. The endometrium is heterogeneous in appearance 10 mm. There is hypoechoic material within the endometrial cavity likely reflecting blood products. The right ovary measures 3.3 x 2.2 x 1.3 cm. The left ovary measures 4.4 x 2.7 x 3.1 cm. There is a 3.2 x 2.5 x 2.6 cm anechoic left ovarian mass most consistent with a cyst. There is no pelvic free fluid.  IMPRESSION: 1. No intrauterine pregnancy identified. Findings meet definitive criteria for failed pregnancy. This follows SRU consensus guidelines: Diagnostic Criteria for Nonviable Pregnancy Early in the First Trimester. Macy Mis Engl J Med [phone];369:1443-51. 2. Heterogeneous endometrium with hypoechoic material within the endometrial cavity likely reflecting blood products.   Electronically Signed   By: Elige KoHetal  Patel   On: 06/20/2013 11:39     MAU Course  Procedures  MDM  CBC, U/S, Quant, Called Dr Dareen PianoAnderson who advised repeat quant in 48 hours and pt has an appointment with Dr Kirtland BouchardK. Ross on Tuesday  Assessment and Plan   A: Failed Pregnancy  P: Repeat BHCG in 48 hours Percocet for pain Pelvic rest x 1 week Keep follow up appointment on Tuesday with Dr Con Memososs   Timiko Offutt M Thera Basden 06/20/2013, 11:16 AM

## 2013-06-22 ENCOUNTER — Inpatient Hospital Stay (HOSPITAL_COMMUNITY)
Admission: AD | Admit: 2013-06-22 | Discharge: 2013-06-22 | Disposition: A | Discharge status: Home / Self Care | Payer: Federal, State, Local not specified - PPO | Source: Ambulatory Visit | Attending: Obstetrics and Gynecology | Admitting: Obstetrics and Gynecology

## 2013-06-22 DIAGNOSIS — O039 Complete or unspecified spontaneous abortion without complication: Secondary | ICD-10-CM | POA: Insufficient documentation

## 2013-06-22 LAB — HCG, QUANTITATIVE, PREGNANCY: hCG, Beta Chain, Quant, S: 1216 m[IU]/mL — ABNORMAL HIGH (ref <5)

## 2013-06-22 NOTE — MAU Note (Signed)
Still bleeding, not soaking pads.  Only took one pain pill since she was here.

## 2013-06-22 NOTE — Discharge Instructions (Signed)
Miscarriage  A miscarriage is the loss of an unborn baby (fetus) before the 20th week of pregnancy. The cause is often unknown.   HOME CARE  · You may need to stay in bed (bed rest), or you may be able to do light activity. Go about activity as told by your doctor.  · Have help at home.  · Write down how many pads you use each day. Write down how soaked they are.  · Do not use tampons. Do not wash out your vagina (douche) or have sex (intercourse) until your doctor approves.  · Only take medicine as told by your doctor.  · Do not take aspirin.  · Keep all doctor visits as told.  · If you or your partner have problems with grieving, talk to your doctor. You can also try counseling. Give yourself time to grieve before trying to get pregnant again.  GET HELP RIGHT AWAY IF:  · You have bad cramps or pain in your back or belly (abdomen).  · You have a fever.  · You pass large clumps of blood (clots) from your vagina that are walnut-sized or larger. Save the clumps for your doctor to see.  · You pass large amounts of tissue from your vagina. Save the tissue for your doctor to see.  · You have more bleeding.  · You have thick, bad-smelling fluid (discharge) coming from the vagina.  · You get lightheaded, weak, or you pass out (faint).  · You have chills.  MAKE SURE YOU:  · Understand these instructions.  · Will watch your condition.  · Will get help right away if you are not doing well or get worse.  Document Released: 05/10/2011 Document Reviewed: 05/10/2011  [employer]® Patient Information ©2014 [employer].

## 2013-06-22 NOTE — MAU Provider Note (Signed)
HPI:  Destiny [name] is 43 y.o. female G2P1001 at 2673w6d who presents for a repeat beta hcg level. The patient was seen two days ago and diagnosed with a failed pregnancy. Her Quant on 4/22 was 6241. Today the patient denies pain; continues to have vaginal bleeding. The bleeding is mild-moderate, pt is not soaking pads.    Objective:  GENERAL: Well-developed, well-nourished female in no acute distress.  HEENT: Normocephalic, atraumatic.   LUNGS: Effort normal HEART: Regular rate  SKIN: Warm, dry and without erythema PSYCH: Normal mood and affect  Filed Vitals:   06/22/13 1731  BP: 112/62  Pulse: 59  Temp: 99.2 F (37.3 C)  Resp: 16   Results for orders placed during the hospital encounter of 06/22/13 (from the past 48 hour(s))  HCG, QUANTITATIVE, PREGNANCY     Status: Abnormal   Collection Time    06/22/13  5:11 PM      Result Value Ref Range   hCG, Beta Chain, Quant, S 1216 (*) <5 mIU/mL   Comment:              GEST. AGE      CONC.  (mIU/mL)       <=1 WEEK        5 - 50         2 WEEKS       50 - 500         3 WEEKS       100 - 10,000         4 WEEKS     1,000 - 30,000         5 WEEKS     3,500 - 115,000       6-8 WEEKS     12,000 - 270,000        12 WEEKS     15,000 - 220,000                FEMALE AND NON-PREGNANT FEMALE:         LESS THAN 5 mIU/mL    MDM Beta hcg level Consulted with Dr. Tenny Crawoss; pt is to keep appointment in the office for Tuesday.   Assessment:   1. Spontaneous miscarriage     Plan:  Discharge home in stable condition Pelvic rest Bleeding precautions. Return to MAU if bleeding worsens Follow up with Dr. Tenny Crawoss on Tuesday as scheduled Support given  Iona HansenJennifer Irene Sharonlee Nine, NP 06/22/2013 7:20 PM

## 2013-10-02 ENCOUNTER — Inpatient Hospital Stay (HOSPITAL_COMMUNITY): Payer: Federal, State, Local not specified - PPO

## 2013-10-02 ENCOUNTER — Inpatient Hospital Stay (HOSPITAL_COMMUNITY)
Admission: AD | Admit: 2013-10-02 | Discharge: 2013-10-02 | Disposition: A | Discharge status: Home / Self Care | Payer: Federal, State, Local not specified - PPO | Source: Ambulatory Visit | Attending: Obstetrics and Gynecology | Admitting: Obstetrics and Gynecology

## 2013-10-02 ENCOUNTER — Encounter (HOSPITAL_COMMUNITY): Payer: Self-pay | Admitting: *Deleted

## 2013-10-02 DIAGNOSIS — O200 Threatened abortion: Secondary | ICD-10-CM | POA: Insufficient documentation

## 2013-10-02 DIAGNOSIS — O09529 Supervision of elderly multigravida, unspecified trimester: Secondary | ICD-10-CM | POA: Insufficient documentation

## 2013-10-02 LAB — CBC
HCT: 37.1 % (ref 36.0–46.0)
HEMOGLOBIN: 12.7 g/dL (ref 12.0–15.0)
MCH: 31.1 pg (ref 26.0–34.0)
MCHC: 34.2 g/dL (ref 30.0–36.0)
MCV: 90.7 fL (ref 78.0–100.0)
Platelets: 256 10*3/uL (ref 150–400)
RBC: 4.09 MIL/uL (ref 3.87–5.11)
RDW: 12.8 % (ref 11.5–15.5)
WBC: 7.5 10*3/uL (ref 4.0–10.5)

## 2013-10-02 LAB — POCT PREGNANCY, URINE: Preg Test, Ur: POSITIVE — AB

## 2013-10-02 LAB — HCG, QUANTITATIVE, PREGNANCY: hCG, Beta Chain, Quant, S: 5209 m[IU]/mL — ABNORMAL HIGH

## 2013-10-02 NOTE — MAU Note (Signed)
Pos HPT in July, spotted during weeks 5&6, bleeding started again yesterday - becoming heavier.  Not saturating through pads.  "Lots of bloody mucus when I wipe."

## 2013-10-02 NOTE — Discharge Instructions (Signed)

## 2013-10-02 NOTE — MAU Provider Note (Signed)
History     CSN: [id number]  Arrival date and time: 10/02/13 1736   First Provider Initiated Contact with Patient 10/02/13 1833      Chief Complaint  Patient presents with  . Vaginal Bleeding   HPI Pt is a 43 yo G3P1011 at [redacted]w[redacted]d wks IUP here with report of vaginal bleeding that started yesterday.  Pt reports passing small clots.  Denies pelvic pain.  Recent SAB in April 2015.  Appointment scheduled for 10/16/13 with Dr. Tenny Craw.    Past Medical History  Diagnosis Date  . AMA (advanced maternal age) multigravida 35+   . Herpes   . 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 Asthma     exercise induced  . Hypotension     Past Surgical History  Procedure Laterality Date  . Hand surgery    . Nasal sinus surgery  2009  . Cesarean section  03/20/2011    Procedure: CESAREAN SECTION;  Surgeon: Freddrick March. Tenny Craw, MD;  Location: [facility];  Service: Gynecology;  Laterality: N/A;  primary cesarean section of baby boy at 0520  APGAR 9/9  . Cardiac catheterization  approx. 2007    catheter ablation    Family History  Problem Relation Age of Onset  . Thyroid disease Mother   . Cancer Mother     breast  . Liver disease Mother     autoimmune hepatitis  . Hypertension Father   . Heart disease Father   . Hyperlipidemia Father   . Heart disease Paternal Grandfather   . Hypertension Paternal Grandfather   . Stroke Maternal Aunt   . Dysrhythmia Maternal Aunt   . Cancer Paternal Grandmother     lung    History  Substance Use Topics  . Smoking status: Never Smoker   . Smokeless tobacco: Never Used  . Alcohol Use: No    Allergies:  Allergies  Allergen Reactions  . Influenza Vaccines Hives    Water blisters on face and hives on chest and stomach and told not to get again    Prescriptions prior to admission  Medication Sig Dispense Refill  . oxyCODONE-acetaminophen (PERCOCET/ROXICET) 5-325 MG per tablet Take 2 tablets by mouth every 4 (four) hours as needed for severe pain.  6 tablet  0   . Prenatal Vit-Fe Fumarate-FA (PRENATAL MULTIVITAMIN) TABS Take 1 tablet by mouth daily. DHA      . PROAIR HFA 108 (90 BASE) MCG/ACT inhaler Inhale 1 puff into the lungs as needed.        ROS Physical Exam   Blood pressure 120/58, pulse 84, temperature 98.6 F (37 C), temperature source Oral, resp. rate 18, height 5' 5.5" (1.664 m), weight 76.749 kg (169 lb 3.2 oz), last menstrual period 07/24/2013, unknown if currently breastfeeding.  Physical Exam  Constitutional: She is oriented to person, place, and time. She appears well-developed and well-nourished. No distress.  HENT:  Head: Normocephalic.  Neck: Normal range of motion. Neck supple.  Cardiovascular: Normal rate, regular rhythm and normal heart sounds.  Exam reveals no gallop and no friction rub.   No murmur heard. Respiratory: Effort normal and breath sounds normal. No respiratory distress.  GI: Soft.  Genitourinary: There is bleeding (moderate; neg clots) around the vagina.  Neurological: She is alert and oriented to person, place, and time.  Skin: Skin is warm and dry.    MAU Course  Procedures  Results for orders placed during the hospital encounter of 10/02/13 (from the past 24 hour(s))  POCT PREGNANCY, URINE     Status: Abnormal   Collection Time    10/02/13  6:23 PM      Result Value Ref Range   Preg Test, Ur POSITIVE (*) NEGATIVE  HCG, QUANTITATIVE, PREGNANCY     Status: Abnormal   Collection Time    10/02/13  6:52 PM      Result Value Ref Range   hCG, Beta Chain, Quant, S 5209 (*) <5 mIU/mL  CBC     Status: None   Collection Time    10/02/13  6:52 PM      Result Value Ref Range   WBC 7.5  4.0 - 10.5 K/uL   RBC 4.09  3.87 - 5.11 MIL/uL   Hemoglobin 12.7  12.0 - 15.0 g/dL   HCT 69.637.1  29.536.0 - 28.446.0 %   MCV 90.7  78.0 - 100.0 fL   MCH 31.1  26.0 - 34.0 pg   MCHC 34.2  30.0 - 36.0 g/dL   RDW 13.212.8  44.011.5 - 10.215.5 %   Platelets 256  150 - 400 K/uL   [place] Ob Comp Less 14 Wks  10/02/2013   CLINICAL DATA:  Heavy  vaginal bleeding.  EXAM: TWIN OBSTETRIC <14WK US AND TRANSVAGINAL OB US  COMPARISON:  Ultrasound 06/20/2013  FINDINGS: TWIN 1  Intrauterine gestational sac: Visualized/normal in shape.  Yolk sac:  Present  Embryo:  Present  Cardiac Activity: Not present  CRL:  7.3  mm   6 w 5 d                  US EDC: 05/22/2013  TWIN 2  Intrauterine gestational sac: Visualized/normal in shape.  Yolk sac:  Present  Embryo:  Present  Cardiac Activity: Not present  CRL:  6.7  mm   6 w 4 d                  US EDC: 05/23/2013  There is a thick intertwin membrane.  Maternal uterus/adnexae: Normal right ovary. Small subchorionic hemorrhage. The left ovary is not visualized. No free fluid in the pelvis.  IMPRESSION: Findings compatible with twin gestation. Study is suggestive of a dichorionic diamniotic gestation. No definite cardiac activity demonstrated.  Findings are suspicious but not yet definitive for failed pregnancy. Recommend follow-up US in 10-14 days for definitive diagnosis. This recommendation follows SRU consensus guidelines: Diagnostic Criteria for Nonviable Pregnancy Early in the First Trimester. Malva Limes Engl J Med 2013; 725:3664-40; 369:1443-51.  These results were called by telephone at the time of interpretation on 10/02/2013 at 8:06 pm to Thressa ShellerHeather Hogan, who verbally acknowledged these results.   Electronically Signed   By: Annia Beltrew  Davis M.D.   On: 10/02/2013 20:12   [place] Ob Transvaginal  10/02/2013   CLINICAL DATA:  Heavy vaginal bleeding.  EXAM: TWIN OBSTETRIC <14WK US AND TRANSVAGINAL OB US  COMPARISON:  Ultrasound 06/20/2013  FINDINGS: TWIN 1  Intrauterine gestational sac: Visualized/normal in shape.  Yolk sac:  Present  Embryo:  Present  Cardiac Activity: Not present  CRL:  7.3  mm   6 w 5 d                  US EDC: 05/22/2013  TWIN 2  Intrauterine gestational sac: Visualized/normal in shape.  Yolk sac:  Present  Embryo:  Present  Cardiac Activity: Not present  CRL:  6.7  mm   6 w 4 d  [place] EDC: 05/23/2013  There is a thick  intertwin membrane.  Maternal uterus/adnexae: Normal right ovary. Small subchorionic hemorrhage. The left ovary is not visualized. No free fluid in the pelvis.  IMPRESSION: Findings compatible with twin gestation. Study is suggestive of a dichorionic diamniotic gestation. No definite cardiac activity demonstrated.  Findings are suspicious but not yet definitive for failed pregnancy. Recommend follow-up US in 10-14 days for definitive diagnosis. This recommendation follows SRU consensus guidelines: Diagnostic Criteria for Nonviable Pregnancy Early in the First Trimester. Malva Limes Med 2013; 161:0960-45.  These results were called by telephone at the time of interpretation on 10/02/2013 at 8:06 pm to Thressa Sheller, who verbally acknowledged these results.   Electronically Signed   By: Annia Belt M.D.   On: 10/02/2013 20:12    1945 Report given to H. Mathews Robinsons who assumes care of patient. Eino Farber Kennith Gain, CNM  Assessment and Plan   1. Threatened miscarriage in early pregnancy    Bleeding precautions Return to MAU as needed FU with the office in one week

## 2013-10-07 ENCOUNTER — Encounter (HOSPITAL_COMMUNITY): Payer: Self-pay

## 2013-10-07 ENCOUNTER — Inpatient Hospital Stay (HOSPITAL_COMMUNITY)
Admission: AD | Admit: 2013-10-07 | Discharge: 2013-10-07 | Disposition: A | Discharge status: Home / Self Care | Payer: Federal, State, Local not specified - PPO | Source: Ambulatory Visit | Attending: Obstetrics and Gynecology | Admitting: Obstetrics and Gynecology

## 2013-10-07 DIAGNOSIS — O09529 Supervision of elderly multigravida, unspecified trimester: Secondary | ICD-10-CM | POA: Insufficient documentation

## 2013-10-07 DIAGNOSIS — O039 Complete or unspecified spontaneous abortion without complication: Secondary | ICD-10-CM | POA: Diagnosis not present

## 2013-10-07 LAB — CBC
HCT: 38.1 % (ref 36.0–46.0)
HEMOGLOBIN: 12.9 g/dL (ref 12.0–15.0)
MCH: 31 pg (ref 26.0–34.0)
MCHC: 33.9 g/dL (ref 30.0–36.0)
MCV: 91.6 fL (ref 78.0–100.0)
PLATELETS: 256 10*3/uL (ref 150–400)
RBC: 4.16 MIL/uL (ref 3.87–5.11)
RDW: 12.8 % (ref 11.5–15.5)
WBC: 6.6 10*3/uL (ref 4.0–10.5)

## 2013-10-07 LAB — HCG, QUANTITATIVE, PREGNANCY: hCG, Beta Chain, Quant, S: 1369 m[IU]/mL — ABNORMAL HIGH (ref <5)

## 2013-10-07 MED ORDER — OXYCODONE-ACETAMINOPHEN 5-325 MG PO TABS
2.0000 | ORAL_TABLET | Freq: Once | ORAL | Status: AC
  Administered 2013-10-07: 2 via ORAL
  Filled 2013-10-07: qty 2

## 2013-10-07 MED ORDER — MISOPROSTOL 200 MCG PO TABS
800.0000 ug | ORAL_TABLET | Freq: Once | ORAL | Status: AC
  Administered 2013-10-07: 800 ug via VAGINAL
  Filled 2013-10-07: qty 4

## 2013-10-07 MED ORDER — PROMETHAZINE HCL 25 MG PO TABS: 12.5000 mg | ORAL_TABLET | Freq: Four times a day (QID) | ORAL | Status: DC | PRN

## 2013-10-07 MED ORDER — HYDROCODONE-ACETAMINOPHEN 5-325 MG PO TABS: 1.0000 | ORAL_TABLET | ORAL | Status: DC | PRN

## 2013-10-07 NOTE — Discharge Instructions (Signed)
Miscarriage A miscarriage is the sudden loss of an unborn baby (fetus) before the 20th week of pregnancy. Most miscarriages happen in the first 3 months of pregnancy. Sometimes, it happens before a woman even knows she is pregnant. A miscarriage is also called a "spontaneous miscarriage" or "early pregnancy loss." Having a miscarriage can be an emotional experience. Talk with your caregiver about any questions you may have about miscarrying, the grieving process, and your future pregnancy plans. CAUSES   Problems with the fetal chromosomes that make it impossible for the baby to develop normally. Problems with the baby's genes or chromosomes are most often the result of errors that occur, by chance, as the embryo divides and grows. The problems are not inherited from the parents.  Infection of the cervix or uterus.   Hormone problems.   Problems with the cervix, such as having an incompetent cervix. This is when the tissue in the cervix is not strong enough to hold the pregnancy.   Problems with the uterus, such as an abnormally shaped uterus, uterine fibroids, or congenital abnormalities.   Certain medical conditions.   Smoking, drinking alcohol, or taking illegal drugs.   Trauma.  Often, the cause of a miscarriage is unknown.  SYMPTOMS   Vaginal bleeding or spotting, with or without cramps or pain.  Pain or cramping in the abdomen or lower back.  Passing fluid, tissue, or blood clots from the vagina. DIAGNOSIS  Your caregiver will perform a physical exam. You may also have an ultrasound to confirm the miscarriage. Blood or urine tests may also be ordered. TREATMENT   Sometimes, treatment is not necessary if you naturally pass all the fetal tissue that was in the uterus. If some of the fetus or placenta remains in the body (incomplete miscarriage), tissue left behind may become infected and must be removed. Usually, a dilation and curettage (D and C) procedure is performed.  During a D and C procedure, the cervix is widened (dilated) and any remaining fetal or placental tissue is gently removed from the uterus.  Antibiotic medicines are prescribed if there is an infection. Other medicines may be given to reduce the size of the uterus (contract) if there is a lot of bleeding.  If you have Rh negative blood and your baby was Rh positive, you will need a Rh immunoglobulin shot. This shot will protect any future baby from having Rh blood problems in future pregnancies. HOME CARE INSTRUCTIONS   Your caregiver may order bed rest or may allow you to continue light activity. Resume activity as directed by your caregiver.  Have someone help with home and family responsibilities during this time.   Keep track of the number of sanitary pads you use each day and how soaked (saturated) they are. Write down this information.   Do not use tampons. Do not douche or have sexual intercourse until approved by your caregiver.   Only take over-the-counter or prescription medicines for pain or discomfort as directed by your caregiver.   Do not take aspirin. Aspirin can cause bleeding.   Keep all follow-up appointments with your caregiver.   If you or your partner have problems with grieving, talk to your caregiver or seek counseling to help cope with the pregnancy loss. Allow enough time to grieve before trying to get pregnant again.  SEEK IMMEDIATE MEDICAL CARE IF:   You have severe cramps or pain in your back or abdomen.  You have a fever.  You pass large blood clots (walnut-sized   or larger) ortissue from your vagina. Save any tissue for your caregiver to inspect.   Your bleeding increases.   You have a thick, bad-smelling vaginal discharge.  You become lightheaded, weak, or you faint.   You have chills.  MAKE SURE YOU:  Understand these instructions.  Will watch your condition.  Will get help right away if you are not doing well or get  worse. Document Released: 08/11/2000 Document Revised: 06/12/2012 Document Reviewed: 04/06/2011 Exit[employer] Patient Information 2015 [employer]. This information is not intended to replace advice given to you by your health care provider. Make sure you discuss any questions you have with your health care provider.  

## 2013-10-07 NOTE — MAU Provider Note (Signed)
History     CSN: [id number]  Arrival date and time: 10/07/13 2048   First Provider Initiated Contact with Patient 10/07/13 2214      Chief Complaint  Patient presents with  . Vaginal Bleeding   HPI  Destiny [name] is a 43 y.o. a G3P1011 at [redacted]w[redacted]d who presents today with heavy vaginal bleeding. She states that she has been bleeding most of the day, but for the last several hours she has been going through a pad an hour. She has also had some cramping. She was seen on 10/02/13, and found to have a di/di twin gestation without cardiac activity. However, it was too early to determine if the lack of cardiac activity was from early gestational age or fetal demise. She has a FU appointment scheduled for 10/09/13.   Past Medical History  Diagnosis Date  . AMA (advanced maternal age) multigravida 35+   . Herpes   . 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 Asthma     exercise induced  . Hypotension     Past Surgical History  Procedure Laterality Date  . Hand surgery    . Nasal sinus surgery  2009  . Cesarean section  03/20/2011    Procedure: CESAREAN SECTION;  Surgeon: Freddrick March. Tenny Craw, MD;  Location: [facility];  Service: Gynecology;  Laterality: N/A;  primary cesarean section of baby boy at 0520  APGAR 9/9  . Cardiac catheterization  approx. 2007    catheter ablation    Family History  Problem Relation Age of Onset  . Thyroid disease Mother   . Cancer Mother     breast  . Liver disease Mother     autoimmune hepatitis  . Hypertension Father   . Heart disease Father   . Hyperlipidemia Father   . Heart disease Paternal Grandfather   . Hypertension Paternal Grandfather   . Stroke Maternal Aunt   . Dysrhythmia Maternal Aunt   . Cancer Paternal Grandmother     lung    History  Substance Use Topics  . Smoking status: Never Smoker   . Smokeless tobacco: Never Used  . Alcohol Use: No    Allergies:  Allergies  Allergen Reactions  . Influenza Vaccines Hives    Water blisters on face  and hives on chest and stomach and told not to get again    Prescriptions prior to admission  Medication Sig Dispense Refill  . ibuprofen (ADVIL,MOTRIN) 200 MG tablet Take 400 mg by mouth every 6 (six) hours as needed for moderate pain.      . Prenatal Vit-Fe Fumarate-FA (PRENATAL MULTIVITAMIN) TABS Take 1 tablet by mouth daily. DHA      . PROAIR HFA 108 (90 BASE) MCG/ACT inhaler Inhale 1 puff into the lungs as needed for shortness of breath.         ROS Physical Exam   Pulse 82, temperature 98.8 F (37.1 C), temperature source Oral, resp. rate 20, height 5' 5.5" (1.664 m), weight 76.476 kg (168 lb 9.6 oz), last menstrual period 07/24/2013, SpO2 100.00%, not currently breastfeeding.  Physical Exam  Nursing note and vitals reviewed. Constitutional: She is oriented to person, place, and time. She appears well-developed and well-nourished. No distress.  Cardiovascular: Normal rate.   Respiratory: Effort normal.  GI: Soft. There is no tenderness.  Genitourinary:   External: no lesion Vagina: small amount of blood seen. One gestational sac in the vagina/right at the cervical os. Removed easily. Sent to pathology.  Cervix:  pink, smooth, slightly dilated, ?some tissue still right inside the os.  Uterus: slightly enlarged, non-tender    Neurological: She is alert and oriented to person, place, and time.  Skin: Skin is warm and dry.  Psychiatric: She has a normal mood and affect.    MAU Course  Procedures Results for Destiny, [name] (MRN [id number]) as of 10/07/2013 22:17  Ref. Range 10/02/2013 18:52 10/02/2013 19:53 10/07/2013 21:07  hCG, Beta Chain, Quant, S Latest Range: <5 mIU/mL 5209 (H)  1369 (H)   Results for orders placed during the hospital encounter of 10/07/13 (from the past 24 hour(s))  CBC     Status: None   Collection Time    10/07/13  9:07 PM      Result Value Ref Range   WBC 6.6  4.0 - 10.5 K/uL   RBC 4.16  3.87 - 5.11 MIL/uL   Hemoglobin 12.9  12.0 - 15.0 g/dL   HCT 40.9   81.1 - 91.4 %   MCV 91.6  78.0 - 100.0 fL   MCH 31.0  26.0 - 34.0 pg   MCHC 33.9  30.0 - 36.0 g/dL   RDW 78.2  95.6 - 21.3 %   Platelets 256  150 - 400 K/uL  HCG, QUANTITATIVE, PREGNANCY     Status: Abnormal   Collection Time    10/07/13  9:07 PM      Result Value Ref Range   hCG, Beta Chain, Quant, S 1369 (*) <5 mIU/mL    2235: D/W Dr. Tenny Craw, ok to offer the patient cytotec.  2254: D/W the patient at length R/B/A discussed. She would like to proceed with cytotec at this time.        Early Intrauterine Pregnancy Failure  x Documented intrauterine pregnancy failure less than or equal to [redacted] weeks gestation  x  No serious current illness  x  Baseline Hgb greater than or equal to 10g/dl  x  Patient has easily accessible transportation to the hospital  xClear preference  x  Practitioner/physician deems patient reliable  x  Counseling by practitioner or physician  x  Patient education by RN  x  Consent form signed  NA  Rho-Gam given by RN if indicated  ___ Medication dispensed   x   Cytotec 800 mcg  __   Intravaginally by patient at home         x   Intravaginally by RN in MAU        __   Rectally by patient at home        __   Rectally by RN in MAU    x  Hydrocodone/acetaminophen 5/325 mg by mouth every 4 to 6 hours as needed  x  Phenergan 12.5 mg by mouth every 4 hours as needed for nausea    Assessment and Plan   1. SAB (spontaneous abortion)    Cytotec today Pathology pending Bleeding precautions  Return to MAU as needed  Follow-up Information   Follow up with [facility] (As scheduled)    Contact information:   [address] [place] [address] [phone]       Medication List         HYDROcodone-acetaminophen 5-325 MG per tablet  Commonly known as:  NORCO/VICODIN  Take 1-2 tablets by mouth every 4 (four) hours as needed for moderate pain.     ibuprofen 200 MG tablet  Commonly  known as:  ADVIL,MOTRIN  Take  400 mg by mouth every 6 (six) hours as needed for moderate pain.     prenatal multivitamin Tabs tablet  Take 1 tablet by mouth daily. DHA     PROAIR HFA 108 (90 BASE) MCG/ACT inhaler  Generic drug:  albuterol  Inhale 1 puff into the lungs as needed for shortness of breath.     promethazine 25 MG tablet  Commonly known as:  PHENERGAN  Take 0.5-1 tablets (12.5-25 mg total) by mouth every 6 (six) hours as needed.         Tawnya CrookHogan, Sedrick Tober Donovan 10/07/2013, 10:17 PM

## 2013-10-07 NOTE — MAU Note (Signed)
Evaluated for vaginal bleeding last week, was told to return if it increased. Bleeding increased today & has been changing full pads every hour. RLQ pain since this afternoon.

## 2013-10-11 ENCOUNTER — Other Ambulatory Visit: Payer: Self-pay | Admitting: Obstetrics and Gynecology

## 2013-10-11 ENCOUNTER — Ambulatory Visit (HOSPITAL_COMMUNITY)
Admission: RE | Admit: 2013-10-11 | Discharge: 2013-10-11 | Disposition: A | Discharge status: Home / Self Care | Payer: Federal, State, Local not specified - PPO | Source: Ambulatory Visit | Attending: Obstetrics and Gynecology | Admitting: Obstetrics and Gynecology

## 2013-10-11 ENCOUNTER — Encounter (HOSPITAL_COMMUNITY): Payer: Self-pay | Admitting: Anesthesiology

## 2013-10-11 ENCOUNTER — Encounter (HOSPITAL_COMMUNITY): Payer: Federal, State, Local not specified - PPO | Admitting: Anesthesiology

## 2013-10-11 ENCOUNTER — Ambulatory Visit (HOSPITAL_COMMUNITY): Payer: Federal, State, Local not specified - PPO | Admitting: Anesthesiology

## 2013-10-11 ENCOUNTER — Encounter (HOSPITAL_COMMUNITY)
Admission: RE | Disposition: A | Discharge status: Home / Self Care | Payer: Self-pay | Source: Ambulatory Visit | Attending: Obstetrics and Gynecology

## 2013-10-11 DIAGNOSIS — O021 Missed abortion: Secondary | ICD-10-CM | POA: Diagnosis present

## 2013-10-11 DIAGNOSIS — Z9889 Other specified postprocedural states: Secondary | ICD-10-CM

## 2013-10-11 DIAGNOSIS — I959 Hypotension, unspecified: Secondary | ICD-10-CM | POA: Insufficient documentation

## 2013-10-11 DIAGNOSIS — J45909 Unspecified asthma, uncomplicated: Secondary | ICD-10-CM | POA: Diagnosis not present

## 2013-10-11 HISTORY — PX: DILATION AND EVACUATION: SHX1459

## 2013-10-11 SURGERY — DILATION AND EVACUATION, UTERUS
Anesthesia: General | Site: Uterus

## 2013-10-11 MED ORDER — FENTANYL CITRATE 0.05 MG/ML IJ SOLN: 25.0000 ug | INTRAMUSCULAR | Status: DC | PRN

## 2013-10-11 MED ORDER — MIDAZOLAM HCL 2 MG/2ML IJ SOLN
INTRAMUSCULAR | Status: DC | PRN
  Administered 2013-10-11: 2 mg via INTRAVENOUS

## 2013-10-11 MED ORDER — KETOROLAC TROMETHAMINE 30 MG/ML IJ SOLN: 15.0000 mg | Freq: Once | INTRAMUSCULAR | Status: DC | PRN

## 2013-10-11 MED ORDER — OXYTOCIN 10 UNIT/ML IJ SOLN
INTRAMUSCULAR | Status: AC
  Filled 2013-10-11: qty 1

## 2013-10-11 MED ORDER — ONDANSETRON HCL 4 MG/2ML IJ SOLN
INTRAMUSCULAR | Status: DC | PRN
  Administered 2013-10-11: 4 mg via INTRAVENOUS

## 2013-10-11 MED ORDER — DEXAMETHASONE SODIUM PHOSPHATE 10 MG/ML IJ SOLN
INTRAMUSCULAR | Status: DC | PRN
  Administered 2013-10-11: 4 mg via INTRAVENOUS

## 2013-10-11 MED ORDER — MIDAZOLAM HCL 2 MG/2ML IJ SOLN
INTRAMUSCULAR | Status: AC
  Filled 2013-10-11: qty 2

## 2013-10-11 MED ORDER — DEXAMETHASONE SODIUM PHOSPHATE 10 MG/ML IJ SOLN
INTRAMUSCULAR | Status: AC
  Filled 2013-10-11: qty 1

## 2013-10-11 MED ORDER — ONDANSETRON HCL 4 MG/2ML IJ SOLN
INTRAMUSCULAR | Status: AC
  Filled 2013-10-11: qty 2

## 2013-10-11 MED ORDER — LIDOCAINE HCL (CARDIAC) 20 MG/ML IV SOLN
INTRAVENOUS | Status: DC | PRN
  Administered 2013-10-11: 50 mg via INTRAVENOUS

## 2013-10-11 MED ORDER — FENTANYL CITRATE 0.05 MG/ML IJ SOLN
INTRAMUSCULAR | Status: AC
  Filled 2013-10-11: qty 2

## 2013-10-11 MED ORDER — FENTANYL CITRATE 0.05 MG/ML IJ SOLN
INTRAMUSCULAR | Status: DC | PRN
  Administered 2013-10-11: 50 ug via INTRAVENOUS

## 2013-10-11 MED ORDER — SCOPOLAMINE 1 MG/3DAYS TD PT72
MEDICATED_PATCH | TRANSDERMAL | Status: AC
  Administered 2013-10-11: 1.5 mg via TRANSDERMAL
  Filled 2013-10-11: qty 1

## 2013-10-11 MED ORDER — LIDOCAINE HCL (CARDIAC) 20 MG/ML IV SOLN
INTRAVENOUS | Status: AC
  Filled 2013-10-11: qty 5

## 2013-10-11 MED ORDER — ONDANSETRON HCL 4 MG/2ML IJ SOLN: 4.0000 mg | Freq: Once | INTRAMUSCULAR | Status: DC | PRN

## 2013-10-11 MED ORDER — MEPERIDINE HCL 25 MG/ML IJ SOLN: 6.2500 mg | INTRAMUSCULAR | Status: DC | PRN

## 2013-10-11 MED ORDER — LACTATED RINGERS IV SOLN
INTRAVENOUS | Status: DC
  Administered 2013-10-11: 11:00:00 via INTRAVENOUS

## 2013-10-11 MED ORDER — PROPOFOL 10 MG/ML IV BOLUS
INTRAVENOUS | Status: DC | PRN
Start: 2013-10-11 — End: 2013-10-11
  Administered 2013-10-11: 150 mg via INTRAVENOUS

## 2013-10-11 MED ORDER — SCOPOLAMINE 1 MG/3DAYS TD PT72
1.0000 | MEDICATED_PATCH | Freq: Once | TRANSDERMAL | Status: DC
  Administered 2013-10-11: 1.5 mg via TRANSDERMAL

## 2013-10-11 MED ORDER — METHYLERGONOVINE MALEATE 0.2 MG/ML IJ SOLN
INTRAMUSCULAR | Status: DC | PRN
  Administered 2013-10-11: 0.2 mg via INTRAMUSCULAR

## 2013-10-11 SURGICAL SUPPLY — 18 items
CATH ROBINSON RED A/P 16FR (CATHETERS) ×3 IMPLANT
CLOTH BEACON ORANGE TIMEOUT ST (SAFETY) ×3 IMPLANT
DECANTER SPIKE VIAL GLASS SM (MISCELLANEOUS) ×3 IMPLANT
GLOVE BIO SURGEON STRL SZ7 (GLOVE) ×3 IMPLANT
GLOVE BIOGEL PI IND STRL 7.0 (GLOVE) ×1 IMPLANT
GLOVE BIOGEL PI INDICATOR 7.0 (GLOVE) ×2
GOWN STRL REUS W/TWL LRG LVL3 (GOWN DISPOSABLE) ×6 IMPLANT
KIT BERKELEY 1ST TRIMESTER 3/8 (MISCELLANEOUS) ×3 IMPLANT
NS IRRIG 1000ML POUR BTL (IV SOLUTION) ×3 IMPLANT
PACK VAGINAL MINOR WOMEN LF (CUSTOM PROCEDURE TRAY) ×3 IMPLANT
PAD OB MATERNITY 4.3X12.25 (PERSONAL CARE ITEMS) ×3 IMPLANT
PAD PREP 24X48 CUFFED NSTRL (MISCELLANEOUS) ×3 IMPLANT
SET BERKELEY SUCTION TUBING (SUCTIONS) ×3 IMPLANT
TOWEL OR 17X24 6PK STRL BLUE (TOWEL DISPOSABLE) ×6 IMPLANT
VACURETTE 10 RIGID CVD (CANNULA) IMPLANT
VACURETTE 7MM CVD STRL WRAP (CANNULA) IMPLANT
VACURETTE 8 RIGID CVD (CANNULA) ×2 IMPLANT
VACURETTE 9 RIGID CVD (CANNULA) IMPLANT

## 2013-10-11 NOTE — Discharge Instructions (Signed)

## 2013-10-11 NOTE — Op Note (Signed)
10/11/2013  12:03 PM  PATIENT:  Destiny [name]  42 y.o. female  PRE-OPERATIVE DIAGNOSIS:  Retained Products of conception   POST-OPERATIVE DIAGNOSIS:  Retained Products of conception   PROCEDURE:  Procedure(s): DILATATION AND EVACUATION (N/A)  SURGEON:  Surgeon(s) and Role:    * Johaan Ryser A Elward Nocera, MD - Primary   ANESTHESIA:   general  EBL:   min   SPECIMEN:  Source of Specimen:  uterine contents  DISPOSITION OF SPECIMEN:  PATHOLOGY  COUNTS:  YES  TOURNIQUET:  * No tourniquets in log *  DICTATION: .Note written in EPIC  PLAN OF CARE: Discharge to home after PACU  PATIENT DISPOSITION:  PACU - hemodynamically stable.   Delay start of Pharmacological VTE agent (>24hrs) due to surgical blood loss or risk of bleeding: not applicable  Medications: Methergine  Complications: None  Findings:  7 week size uterus to 6 size post procedure.  Good crie was achieved.  After adequate anesthesia was achieved, the patient was prepped and draped in the usual sterile fashion.  The speculum was placed in the vagina and the cervix stabilized with a single-tooth tenaculum.  The cervix was dilated with Pratt dilators and the 9 mm curette was used to remove contents of the uterus.  Alternating sharp curettage with a curette and suction curettage was performed until all contents were removed and good crie was achieved.  All instruments were removed from the vagina.  The patient tolerated the procedure well.    Devell Parkerson A      

## 2013-10-11 NOTE — Anesthesia Preprocedure Evaluation (Signed)
Anesthesia Evaluation    Reviewed: Allergy & Precautions, H&P , NPO status , Patient's Chart, lab work & pertinent test results  Airway Mallampati: I TM Distance: >3 FB Neck ROM: full    Dental no notable dental hx. (+) Teeth Intact   Pulmonary neg pulmonary ROS,    Pulmonary exam normal       Cardiovascular     Neuro/Psych negative neurological ROS  negative psych ROS   GI/Hepatic negative GI ROS, Neg liver ROS,   Endo/Other  negative endocrine ROS  Renal/GU negative Renal ROS     Musculoskeletal   Abdominal Normal abdominal exam  (+)   Peds  Hematology negative hematology ROS (+)   Anesthesia Other Findings   Reproductive/Obstetrics negative OB ROS                           Anesthesia Physical Anesthesia Plan  ASA: I  Anesthesia Plan: General   Post-op Pain Management:    Induction: Intravenous  Airway Management Planned: LMA  Additional Equipment:   Intra-op Plan:   Post-operative Plan:   Informed Consent: I have reviewed the patients History and Physical, chart, labs and discussed the procedure including the risks, benefits and alternatives for the proposed anesthesia with the patient or authorized representative who has indicated his/her understanding and acceptance.     Plan Discussed with: CRNA and Surgeon  Anesthesia Plan Comments:         Anesthesia Quick Evaluation

## 2013-10-11 NOTE — Brief Op Note (Signed)
10/11/2013  12:03 PM  PATIENT:  Destiny [name]  43 y.o. female  PRE-OPERATIVE DIAGNOSIS:  Retained Products of conception   POST-OPERATIVE DIAGNOSIS:  Retained Products of conception   PROCEDURE:  Procedure(s): DILATATION AND EVACUATION (N/A)  SURGEON:  Surgeon(s) and Role:    * Loney LaurenceMichelle A Freman Lapage, MD - Primary   ANESTHESIA:   general  EBL:   min   SPECIMEN:  Source of Specimen:  uterine contents  DISPOSITION OF SPECIMEN:  PATHOLOGY  COUNTS:  YES  TOURNIQUET:  * No tourniquets in log *  DICTATION: .Note written in EPIC  PLAN OF CARE: Discharge to home after PACU  PATIENT DISPOSITION:  PACU - hemodynamically stable.   Delay start of Pharmacological VTE agent (>24hrs) due to surgical blood loss or risk of bleeding: not applicable  Medications: Methergine  Complications: None  Findings:  7 week size uterus to 6 size post procedure.  Good crie was achieved.  After adequate anesthesia was achieved, the patient was prepped and draped in the usual sterile fashion.  The speculum was placed in the vagina and the cervix stabilized with a single-tooth tenaculum.  The cervix was dilated with Shawnie PonsPratt dilators and the 9 mm curette was used to remove contents of the uterus.  Alternating sharp curettage with a curette and suction curettage was performed until all contents were removed and good crie was achieved.  All instruments were removed from the vagina.  The patient tolerated the procedure well.    Kaicee Scarpino A

## 2013-10-11 NOTE — H&P (Signed)
43 y.o. yo G3P1011 4110w6d mab of twin pregnancy passed most tissue this past Sunday and was given cytotec to pass rest of tissue.  US in office however shows >1 cm echogenic area c/w retained products.  Pt counseled and desires D&E for retained products.  Pt reports she is Rh +.  Past Medical History  Diagnosis Date  . AMA (advanced maternal age) multigravida 35+   . Herpes   . 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 Asthma     exercise induced  . Hypotension    Past Surgical History  Procedure Laterality Date  . Hand surgery    . Nasal sinus surgery  2009  . Cesarean section  03/20/2011    Procedure: CESAREAN SECTION;  Surgeon: Freddrick MarchKendra H. Tenny Crawoss, MD;  Location: [facility];  Service: Gynecology;  Laterality: N/A;  primary cesarean section of baby boy at 0520  APGAR 9/9  . Cardiac catheterization  approx. 2007    catheter ablation    History   Social History  . Marital Status: Married    Spouse Name: N/A    Number of Children: N/A  . Years of Education: N/A   Occupational History  . Not on file.   Social History Main Topics  . Smoking status: Never Smoker   . Smokeless tobacco: Never Used  . Alcohol Use: No  . Drug Use: No  . Sexual Activity: Yes   Other Topics Concern  . Not on file   Social History Narrative  . No narrative on file    No current facility-administered medications on file prior to encounter.   Current Outpatient Prescriptions on File Prior to Encounter  Medication Sig Dispense Refill  . HYDROcodone-acetaminophen (NORCO/VICODIN) 5-325 MG per tablet Take 1-2 tablets by mouth every 4 (four) hours as needed for moderate pain.  20 tablet  0  . ibuprofen (ADVIL,MOTRIN) 200 MG tablet Take 400 mg by mouth every 6 (six) hours as needed for moderate pain.      . Prenatal Vit-Fe Fumarate-FA (PRENATAL MULTIVITAMIN) TABS Take 1 tablet by mouth daily. DHA      . PROAIR HFA 108 (90 BASE) MCG/ACT inhaler Inhale 1 puff into the lungs as needed for shortness of breath.        . promethazine (PHENERGAN) 25 MG tablet Take 0.5-1 tablets (12.5-25 mg total) by mouth every 6 (six) hours as needed.  30 tablet  0    Allergies  Allergen Reactions  . Influenza Vaccines Hives    Water blisters on face and hives on chest and stomach and told not to get again    @VITALS2 @  Lungs: clear to ascultation Cor:  RRR Abdomen:  soft, nontender, nondistended. Ex:  no cords, erythema Pelvic:  Closed cervix, about 6-7 weeks size and non tender.  A:  Retained products.   P:  For D&E.   All risks, benefits and alternatives d/w patient and she desires to proceed.     Latif Nazareno A

## 2013-10-11 NOTE — Transfer of Care (Signed)
Immediate Anesthesia Transfer of Care Note  Patient: Destiny [name]  Procedure(s) Performed: Procedure(s): DILATATION AND EVACUATION (N/A)  Patient Location: PACU  Anesthesia Type:General  Level of Consciousness: awake, alert  and oriented  Airway & Oxygen Therapy: Patient Spontanous Breathing and Patient connected to nasal cannula oxygen  Post-op Assessment: Report given to PACU RN and Post -op Vital signs reviewed and stable  Post vital signs: Reviewed and stable  Complications: No apparent anesthesia complications

## 2013-10-11 NOTE — Anesthesia Postprocedure Evaluation (Signed)
Anesthesia Post Note  Patient: Destiny [name]  Procedure(s) Performed: Procedure(s) (LRB): DILATATION AND EVACUATION (N/A)  Anesthesia type: General  Patient location: PACU  Post pain: Pain level controlled  Post assessment: Post-op Vital signs reviewed  Last Vitals:  Filed Vitals:   10/11/13 1230  BP: 106/60  Pulse: 63  Temp:   Resp: 15    Post vital signs: Reviewed  Level of consciousness: sedated  Complications: No apparent anesthesia complications

## 2013-10-12 ENCOUNTER — Encounter (HOSPITAL_COMMUNITY): Payer: Self-pay | Admitting: Obstetrics and Gynecology

## 2013-12-18 ENCOUNTER — Other Ambulatory Visit: Payer: Self-pay

## 2013-12-18 DIAGNOSIS — Z1231 Encounter for screening mammogram for malignant neoplasm of breast: Secondary | ICD-10-CM

## 2013-12-31 ENCOUNTER — Encounter (HOSPITAL_COMMUNITY): Payer: Self-pay | Admitting: Obstetrics and Gynecology

## 2014-01-28 ENCOUNTER — Ambulatory Visit
Admission: RE | Admit: 2014-01-28 | Discharge: 2014-01-28 | Disposition: A | Discharge status: Home / Self Care | Payer: Federal, State, Local not specified - PPO | Source: Ambulatory Visit

## 2014-01-28 ENCOUNTER — Encounter (INDEPENDENT_AMBULATORY_CARE_PROVIDER_SITE_OTHER): Payer: Self-pay

## 2014-01-28 DIAGNOSIS — Z1231 Encounter for screening mammogram for malignant neoplasm of breast: Secondary | ICD-10-CM

## 2014-08-07 ENCOUNTER — Encounter (HOSPITAL_COMMUNITY): Payer: Self-pay | Admitting: *Deleted

## 2014-12-23 ENCOUNTER — Other Ambulatory Visit: Payer: Self-pay

## 2014-12-23 DIAGNOSIS — Z1231 Encounter for screening mammogram for malignant neoplasm of breast: Secondary | ICD-10-CM

## 2015-02-03 ENCOUNTER — Ambulatory Visit
Admission: RE | Admit: 2015-02-03 | Discharge: 2015-02-03 | Disposition: A | Discharge status: Home / Self Care | Payer: Federal, State, Local not specified - PPO | Source: Ambulatory Visit

## 2015-02-03 DIAGNOSIS — Z1231 Encounter for screening mammogram for malignant neoplasm of breast: Secondary | ICD-10-CM

## 2015-08-27 ENCOUNTER — Other Ambulatory Visit: Payer: Self-pay | Admitting: Obstetrics and Gynecology

## 2015-08-28 LAB — CYTOLOGY - PAP

## 2016-02-02 ENCOUNTER — Other Ambulatory Visit: Payer: Self-pay | Admitting: Obstetrics and Gynecology

## 2016-02-02 DIAGNOSIS — Z1231 Encounter for screening mammogram for malignant neoplasm of breast: Secondary | ICD-10-CM

## 2016-03-17 ENCOUNTER — Ambulatory Visit: Payer: Federal, State, Local not specified - PPO

## 2016-03-18 ENCOUNTER — Ambulatory Visit: Payer: Federal, State, Local not specified - PPO

## 2016-04-07 ENCOUNTER — Other Ambulatory Visit: Payer: Self-pay | Admitting: Obstetrics and Gynecology

## 2017-04-07 LAB — OB RESULTS CONSOLE GC/CHLAMYDIA
CHLAMYDIA, DNA PROBE: NEGATIVE
Gonorrhea: NEGATIVE

## 2017-04-07 LAB — OB RESULTS CONSOLE ABO/RH: RH Type: POSITIVE

## 2017-04-07 LAB — OB RESULTS CONSOLE RUBELLA ANTIBODY, IGM: Rubella: NON-IMMUNE/NOT IMMUNE

## 2017-04-07 LAB — OB RESULTS CONSOLE RPR: RPR: NONREACTIVE

## 2017-04-07 LAB — OB RESULTS CONSOLE HIV ANTIBODY (ROUTINE TESTING): HIV: NONREACTIVE

## 2017-04-07 LAB — OB RESULTS CONSOLE HEPATITIS B SURFACE ANTIGEN: Hepatitis B Surface Ag: NEGATIVE

## 2017-04-07 LAB — OB RESULTS CONSOLE ANTIBODY SCREEN: Antibody Screen: NEGATIVE

## 2017-10-05 LAB — OB RESULTS CONSOLE GBS: GBS: NEGATIVE

## 2017-10-13 ENCOUNTER — Other Ambulatory Visit: Payer: Self-pay | Admitting: Obstetrics and Gynecology

## 2017-10-14 ENCOUNTER — Telehealth (HOSPITAL_COMMUNITY): Payer: Self-pay | Admitting: *Deleted

## 2017-10-14 ENCOUNTER — Encounter (HOSPITAL_COMMUNITY): Payer: Self-pay | Admitting: *Deleted

## 2017-10-14 NOTE — Telephone Encounter (Signed)
Preadmission screen  

## 2017-10-18 ENCOUNTER — Encounter (HOSPITAL_COMMUNITY): Payer: Self-pay

## 2017-10-28 ENCOUNTER — Encounter (HOSPITAL_COMMUNITY)
Admission: RE | Admit: 2017-10-28 | Discharge: 2017-10-28 | Disposition: A | Discharge status: Home / Self Care | Payer: Federal, State, Local not specified - PPO | Source: Ambulatory Visit | Attending: Obstetrics and Gynecology | Admitting: Obstetrics and Gynecology

## 2017-10-28 HISTORY — DX: Hypothyroidism, unspecified: E03.9

## 2017-10-28 NOTE — Patient Instructions (Signed)
Lucianne Leishley C Freeney  10/28/2017   Your procedure is scheduled on:  11/01/2017  [facility]30 AM.  Pick up the phone at the desk and dial [phone]  Call this number if you have problems the morning of surgery:[phone]  Remember:   Do not eat food:(After Midnight) Desps de medianoche.  Do not drink clear liquids: (After Midnight) Desps de medianoche.  Take these medicines the morning of surgery with A SIP OF WATER: none   Do not wear jewelry, make-up or nail polish.  Do not wear lotions, powders, or perfumes. Do not wear deodorant.  Do not shave 48 hours prior to surgery.  Do not bring valuables to the hospital.  [facility] is not   responsible for any belongings or valuables brought to the hospital.  Contacts, dentures or bridgework may not be worn into surgery.  Leave suitcase in the car. After surgery it may be brought to your room.  For patients admitted to the hospital, checkout time is 11:00 AM the day of              discharge.    N/A   Please read over the following fact sheets that you were given:   Surgical Site Infection Prevention

## 2017-10-31 LAB — ABO/RH: ABO/RH(D): B POS

## 2017-10-31 LAB — CBC
HCT: 38.4 % (ref 36.0–46.0)
Hemoglobin: 13.3 g/dL (ref 12.0–15.0)
MCH: 31.6 pg (ref 26.0–34.0)
MCHC: 34.6 g/dL (ref 30.0–36.0)
MCV: 91.2 fL (ref 78.0–100.0)
Platelets: 226 10*3/uL (ref 150–400)
RBC: 4.21 MIL/uL (ref 3.87–5.11)
RDW: 13.4 % (ref 11.5–15.5)
WBC: 8.6 10*3/uL (ref 4.0–10.5)

## 2017-10-31 LAB — TYPE AND SCREEN
ABO/RH(D): B POS
Antibody Screen: NEGATIVE

## 2017-11-01 ENCOUNTER — Encounter (HOSPITAL_COMMUNITY)
Admission: RE | Disposition: A | Discharge status: Home / Self Care | Payer: Self-pay | Source: Home / Self Care | Attending: Obstetrics and Gynecology

## 2017-11-01 ENCOUNTER — Other Ambulatory Visit: Payer: Self-pay

## 2017-11-01 ENCOUNTER — Inpatient Hospital Stay (HOSPITAL_COMMUNITY)
Admission: RE | Admit: 2017-11-01 | Discharge: 2017-11-04 | DRG: 785 | Disposition: A | Discharge status: Home / Self Care | Payer: Federal, State, Local not specified - PPO | Attending: Obstetrics and Gynecology | Admitting: Obstetrics and Gynecology

## 2017-11-01 ENCOUNTER — Encounter (HOSPITAL_COMMUNITY): Payer: Self-pay | Admitting: *Deleted

## 2017-11-01 ENCOUNTER — Inpatient Hospital Stay (HOSPITAL_COMMUNITY): Payer: Federal, State, Local not specified - PPO | Admitting: Registered Nurse

## 2017-11-01 DIAGNOSIS — Z302 Encounter for sterilization: Secondary | ICD-10-CM

## 2017-11-01 DIAGNOSIS — O34211 Maternal care for low transverse scar from previous cesarean delivery: Secondary | ICD-10-CM | POA: Diagnosis present

## 2017-11-01 DIAGNOSIS — Z3A39 39 weeks gestation of pregnancy: Secondary | ICD-10-CM | POA: Diagnosis not present

## 2017-11-01 LAB — CBC
HCT: 34.6 % — ABNORMAL LOW (ref 36.0–46.0)
Hemoglobin: 11.9 g/dL — ABNORMAL LOW (ref 12.0–15.0)
MCH: 31.2 pg (ref 26.0–34.0)
MCHC: 34.4 g/dL (ref 30.0–36.0)
MCV: 90.8 fL (ref 78.0–100.0)
Platelets: 203 10*3/uL (ref 150–400)
RBC: 3.81 MIL/uL — ABNORMAL LOW (ref 3.87–5.11)
RDW: 13.4 % (ref 11.5–15.5)
WBC: 13.2 10*3/uL — ABNORMAL HIGH (ref 4.0–10.5)

## 2017-11-01 LAB — ABO/RH: ABO/RH(D): B POS

## 2017-11-01 LAB — BASIC METABOLIC PANEL
Anion gap: 11 (ref 5–15)
BUN: 11 mg/dL (ref 6–20)
CO2: 18 mmol/L — ABNORMAL LOW (ref 22–32)
CREATININE: 0.59 mg/dL (ref 0.44–1.00)
Calcium: 8.5 mg/dL — ABNORMAL LOW (ref 8.9–10.3)
Chloride: 105 mmol/L (ref 98–111)
GFR calc Af Amer: >60 mL/min (ref >60)
GFR calc non Af Amer: >60 mL/min (ref >60)
GLUCOSE: 122 mg/dL — AB (ref 70–99)
Potassium: 4 mmol/L (ref 3.5–5.1)
Sodium: 134 mmol/L — ABNORMAL LOW (ref 135–145)

## 2017-11-01 LAB — RPR: RPR: NONREACTIVE

## 2017-11-01 SURGERY — Surgical Case
Anesthesia: Spinal | Laterality: Bilateral | Wound class: Clean Contaminated

## 2017-11-01 MED ORDER — SCOPOLAMINE 1 MG/3DAYS TD PT72
MEDICATED_PATCH | TRANSDERMAL | Status: AC
  Filled 2017-11-01: qty 1

## 2017-11-01 MED ORDER — FENTANYL CITRATE (PF) 100 MCG/2ML IJ SOLN: 25.0000 ug | INTRAMUSCULAR | Status: DC | PRN

## 2017-11-01 MED ORDER — ENOXAPARIN SODIUM 60 MG/0.6ML ~~LOC~~ SOLN
50.0000 mg | SUBCUTANEOUS | Status: DC
  Administered 2017-11-02 – 2017-11-04 (×3): 50 mg via SUBCUTANEOUS
  Filled 2017-11-01 (×5): qty 0.6

## 2017-11-01 MED ORDER — ACETAMINOPHEN 325 MG PO TABS
650.0000 mg | ORAL_TABLET | ORAL | Status: DC | PRN
  Administered 2017-11-02 – 2017-11-03 (×2): 650 mg via ORAL
  Filled 2017-11-01 (×2): qty 2

## 2017-11-01 MED ORDER — NALOXONE HCL 4 MG/10ML IJ SOLN
1.0000 ug/kg/h | INTRAVENOUS | Status: DC | PRN
  Filled 2017-11-01: qty 5

## 2017-11-01 MED ORDER — OXYCODONE-ACETAMINOPHEN 5-325 MG PO TABS: 1.0000 | ORAL_TABLET | ORAL | Status: DC | PRN

## 2017-11-01 MED ORDER — METOCLOPRAMIDE HCL 5 MG/ML IJ SOLN
INTRAMUSCULAR | Status: DC | PRN
  Administered 2017-11-01: 10 mg via INTRAVENOUS

## 2017-11-01 MED ORDER — LACTATED RINGERS IV SOLN
INTRAVENOUS | Status: DC
  Administered 2017-11-01 (×3): via INTRAVENOUS

## 2017-11-01 MED ORDER — ONDANSETRON HCL 4 MG/2ML IJ SOLN
INTRAMUSCULAR | Status: DC | PRN
  Administered 2017-11-01: 4 mg via INTRAVENOUS

## 2017-11-01 MED ORDER — METOCLOPRAMIDE HCL 5 MG/ML IJ SOLN
INTRAMUSCULAR | Status: AC
  Filled 2017-11-01: qty 4

## 2017-11-01 MED ORDER — KETOROLAC TROMETHAMINE 30 MG/ML IJ SOLN
30.0000 mg | Freq: Four times a day (QID) | INTRAMUSCULAR | Status: AC | PRN
  Administered 2017-11-01: 30 mg via INTRAMUSCULAR

## 2017-11-01 MED ORDER — ONDANSETRON HCL 4 MG/2ML IJ SOLN: 4.0000 mg | Freq: Three times a day (TID) | INTRAMUSCULAR | Status: DC | PRN

## 2017-11-01 MED ORDER — DEXAMETHASONE SODIUM PHOSPHATE 10 MG/ML IJ SOLN
INTRAMUSCULAR | Status: DC | PRN
  Administered 2017-11-01: 5 mg via INTRAVENOUS

## 2017-11-01 MED ORDER — METHYLERGONOVINE MALEATE 0.2 MG/ML IJ SOLN: 0.2000 mg | INTRAMUSCULAR | Status: DC | PRN

## 2017-11-01 MED ORDER — SOD CITRATE-CITRIC ACID 500-334 MG/5ML PO SOLN
30.0000 mL | Freq: Once | ORAL | Status: AC
  Administered 2017-11-01: 30 mL via ORAL

## 2017-11-01 MED ORDER — WITCH HAZEL-GLYCERIN EX PADS: 1.0000 "application " | MEDICATED_PAD | CUTANEOUS | Status: DC | PRN

## 2017-11-01 MED ORDER — SENNOSIDES-DOCUSATE SODIUM 8.6-50 MG PO TABS
2.0000 | ORAL_TABLET | ORAL | Status: DC
  Administered 2017-11-01 – 2017-11-04 (×3): 2 via ORAL
  Filled 2017-11-01 (×3): qty 2

## 2017-11-01 MED ORDER — IBUPROFEN 600 MG PO TABS
600.0000 mg | ORAL_TABLET | Freq: Four times a day (QID) | ORAL | Status: DC
  Administered 2017-11-01 – 2017-11-04 (×12): 600 mg via ORAL
  Filled 2017-11-01 (×12): qty 1

## 2017-11-01 MED ORDER — DIPHENHYDRAMINE HCL 25 MG PO CAPS
25.0000 mg | ORAL_CAPSULE | ORAL | Status: DC | PRN
  Filled 2017-11-01: qty 1

## 2017-11-01 MED ORDER — METOCLOPRAMIDE HCL 5 MG/ML IJ SOLN: 10.0000 mg | Freq: Once | INTRAMUSCULAR | Status: DC | PRN

## 2017-11-01 MED ORDER — BUPIVACAINE IN DEXTROSE 0.75-8.25 % IT SOLN
INTRATHECAL | Status: DC | PRN
  Administered 2017-11-01: 10.5 mg via INTRATHECAL

## 2017-11-01 MED ORDER — DIPHENHYDRAMINE HCL 50 MG/ML IJ SOLN: 12.5000 mg | INTRAMUSCULAR | Status: DC | PRN

## 2017-11-01 MED ORDER — FENTANYL CITRATE (PF) 100 MCG/2ML IJ SOLN
INTRAMUSCULAR | Status: AC
  Filled 2017-11-01: qty 2

## 2017-11-01 MED ORDER — SOD CITRATE-CITRIC ACID 500-334 MG/5ML PO SOLN
ORAL | Status: AC
  Filled 2017-11-01: qty 15

## 2017-11-01 MED ORDER — NALBUPHINE HCL 10 MG/ML IJ SOLN: 5.0000 mg | INTRAMUSCULAR | Status: DC | PRN

## 2017-11-01 MED ORDER — SIMETHICONE 80 MG PO CHEW
80.0000 mg | CHEWABLE_TABLET | Freq: Three times a day (TID) | ORAL | Status: DC
  Administered 2017-11-02 – 2017-11-04 (×7): 80 mg via ORAL
  Filled 2017-11-01 (×8): qty 1

## 2017-11-01 MED ORDER — METHYLERGONOVINE MALEATE 0.2 MG PO TABS: 0.2000 mg | ORAL_TABLET | ORAL | Status: DC | PRN

## 2017-11-01 MED ORDER — ONDANSETRON HCL 4 MG/2ML IJ SOLN
INTRAMUSCULAR | Status: AC
  Filled 2017-11-01: qty 2

## 2017-11-01 MED ORDER — NALBUPHINE HCL 10 MG/ML IJ SOLN
5.0000 mg | Freq: Once | INTRAMUSCULAR | Status: AC | PRN
  Administered 2017-11-01: 5 mg via SUBCUTANEOUS

## 2017-11-01 MED ORDER — FENTANYL CITRATE (PF) 100 MCG/2ML IJ SOLN
INTRAMUSCULAR | Status: DC | PRN
  Administered 2017-11-01: 15 ug via INTRATHECAL

## 2017-11-01 MED ORDER — MENTHOL 3 MG MT LOZG: 1.0000 | LOZENGE | OROMUCOSAL | Status: DC | PRN

## 2017-11-01 MED ORDER — MORPHINE SULFATE (PF) 0.5 MG/ML IJ SOLN
INTRAMUSCULAR | Status: DC | PRN
  Administered 2017-11-01: .15 mg via EPIDURAL

## 2017-11-01 MED ORDER — KETOROLAC TROMETHAMINE 30 MG/ML IJ SOLN
INTRAMUSCULAR | Status: AC
  Filled 2017-11-01: qty 1

## 2017-11-01 MED ORDER — OXYTOCIN 10 UNIT/ML IJ SOLN
INTRAMUSCULAR | Status: AC
  Filled 2017-11-01: qty 4

## 2017-11-01 MED ORDER — COCONUT OIL OIL: 1.0000 "application " | TOPICAL_OIL | Status: DC | PRN

## 2017-11-01 MED ORDER — OXYTOCIN 40 UNITS IN LACTATED RINGERS INFUSION - SIMPLE MED: 2.5000 [IU]/h | INTRAVENOUS | Status: AC

## 2017-11-01 MED ORDER — LACTATED RINGERS IV SOLN
INTRAVENOUS | Status: DC
  Administered 2017-11-01 – 2017-11-02 (×2): via INTRAVENOUS

## 2017-11-01 MED ORDER — ZOLPIDEM TARTRATE 5 MG PO TABS: 5.0000 mg | ORAL_TABLET | Freq: Every evening | ORAL | Status: DC | PRN

## 2017-11-01 MED ORDER — PHENYLEPHRINE 8 MG IN D5W 100 ML (0.08MG/ML) PREMIX OPTIME
INJECTION | INTRAVENOUS | Status: AC
  Filled 2017-11-01: qty 100

## 2017-11-01 MED ORDER — MEPERIDINE HCL 25 MG/ML IJ SOLN: 6.2500 mg | INTRAMUSCULAR | Status: DC | PRN

## 2017-11-01 MED ORDER — DIPHENHYDRAMINE HCL 25 MG PO CAPS: 25.0000 mg | ORAL_CAPSULE | Freq: Four times a day (QID) | ORAL | Status: DC | PRN

## 2017-11-01 MED ORDER — DIBUCAINE 1 % RE OINT: 1.0000 "application " | TOPICAL_OINTMENT | RECTAL | Status: DC | PRN

## 2017-11-01 MED ORDER — SCOPOLAMINE 1 MG/3DAYS TD PT72: 1.0000 | MEDICATED_PATCH | Freq: Once | TRANSDERMAL | Status: DC

## 2017-11-01 MED ORDER — OXYCODONE-ACETAMINOPHEN 5-325 MG PO TABS: 2.0000 | ORAL_TABLET | ORAL | Status: DC | PRN

## 2017-11-01 MED ORDER — NALBUPHINE HCL 10 MG/ML IJ SOLN
INTRAMUSCULAR | Status: AC
  Filled 2017-11-01: qty 1

## 2017-11-01 MED ORDER — SCOPOLAMINE 1 MG/3DAYS TD PT72
1.0000 | MEDICATED_PATCH | Freq: Once | TRANSDERMAL | Status: DC
  Administered 2017-11-01: 1.5 mg via TRANSDERMAL

## 2017-11-01 MED ORDER — PRENATAL MULTIVITAMIN CH
1.0000 | ORAL_TABLET | Freq: Every day | ORAL | Status: DC
  Administered 2017-11-02 – 2017-11-03 (×2): 1 via ORAL
  Filled 2017-11-01 (×2): qty 1

## 2017-11-01 MED ORDER — ACETAMINOPHEN 500 MG PO TABS
1000.0000 mg | ORAL_TABLET | Freq: Four times a day (QID) | ORAL | Status: AC
  Administered 2017-11-01 – 2017-11-02 (×3): 1000 mg via ORAL
  Filled 2017-11-01 (×3): qty 2

## 2017-11-01 MED ORDER — NALOXONE HCL 0.4 MG/ML IJ SOLN: 0.4000 mg | INTRAMUSCULAR | Status: DC | PRN

## 2017-11-01 MED ORDER — KETOROLAC TROMETHAMINE 30 MG/ML IJ SOLN: 30.0000 mg | Freq: Four times a day (QID) | INTRAMUSCULAR | Status: AC | PRN

## 2017-11-01 MED ORDER — PHENYLEPHRINE 8 MG IN D5W 100 ML (0.08MG/ML) PREMIX OPTIME
INJECTION | INTRAVENOUS | Status: DC | PRN
  Administered 2017-11-01: 60 ug/min via INTRAVENOUS

## 2017-11-01 MED ORDER — MORPHINE SULFATE (PF) 0.5 MG/ML IJ SOLN
INTRAMUSCULAR | Status: AC
  Filled 2017-11-01: qty 10

## 2017-11-01 MED ORDER — LACTATED RINGERS IV SOLN: INTRAVENOUS | Status: DC

## 2017-11-01 MED ORDER — SIMETHICONE 80 MG PO CHEW
80.0000 mg | CHEWABLE_TABLET | ORAL | Status: DC
  Administered 2017-11-01 – 2017-11-04 (×3): 80 mg via ORAL
  Filled 2017-11-01 (×3): qty 1

## 2017-11-01 MED ORDER — NALBUPHINE HCL 10 MG/ML IJ SOLN: 5.0000 mg | Freq: Once | INTRAMUSCULAR | Status: AC | PRN

## 2017-11-01 MED ORDER — SODIUM CHLORIDE 0.9% FLUSH: 3.0000 mL | INTRAVENOUS | Status: DC | PRN

## 2017-11-01 MED ORDER — OXYTOCIN 10 UNIT/ML IJ SOLN
INTRAVENOUS | Status: DC | PRN
  Administered 2017-11-01: 40 [IU] via INTRAVENOUS

## 2017-11-01 MED ORDER — DEXAMETHASONE SODIUM PHOSPHATE 10 MG/ML IJ SOLN
INTRAMUSCULAR | Status: AC
  Filled 2017-11-01: qty 1

## 2017-11-01 MED ORDER — CEFAZOLIN SODIUM-DEXTROSE 2-4 GM/100ML-% IV SOLN
2.0000 g | INTRAVENOUS | Status: AC
  Administered 2017-11-01: 2 g via INTRAVENOUS
  Filled 2017-11-01: qty 100

## 2017-11-01 MED ORDER — THYROID 60 MG PO TABS
120.0000 mg | ORAL_TABLET | Freq: Every day | ORAL | Status: DC
  Administered 2017-11-02 – 2017-11-04 (×3): 120 mg via ORAL
  Filled 2017-11-01 (×4): qty 1
  Filled 2017-11-01: qty 2

## 2017-11-01 MED ORDER — SIMETHICONE 80 MG PO CHEW: 80.0000 mg | CHEWABLE_TABLET | ORAL | Status: DC | PRN

## 2017-11-01 SURGICAL SUPPLY — 33 items
ADH SKN CLS APL DERMABOND .7 (GAUZE/BANDAGES/DRESSINGS) ×1
CHLORAPREP W/TINT 26ML (MISCELLANEOUS) ×3 IMPLANT
CLAMP CORD UMBIL (MISCELLANEOUS) IMPLANT
CLOTH BEACON ORANGE TIMEOUT ST (SAFETY) ×3 IMPLANT
DERMABOND ADVANCED (GAUZE/BANDAGES/DRESSINGS) ×2
DERMABOND ADVANCED .7 DNX12 (GAUZE/BANDAGES/DRESSINGS) IMPLANT
DRSG OPSITE POSTOP 4X10 (GAUZE/BANDAGES/DRESSINGS) ×3 IMPLANT
ELECT REM PT RETURN 9FT ADLT (ELECTROSURGICAL) ×3
ELECTRODE REM PT RTRN 9FT ADLT (ELECTROSURGICAL) ×1 IMPLANT
EXTRACTOR VACUUM M CUP 4 TUBE (SUCTIONS) IMPLANT
EXTRACTOR VACUUM M CUP 4' TUBE (SUCTIONS)
GLOVE BIO SURGEON STRL SZ7 (GLOVE) ×3 IMPLANT
GLOVE BIOGEL PI IND STRL 7.0 (GLOVE) ×2 IMPLANT
GLOVE BIOGEL PI INDICATOR 7.0 (GLOVE) ×4
GOWN STRL REUS W/TWL LRG LVL3 (GOWN DISPOSABLE) ×6 IMPLANT
KIT ABG SYR 3ML LUER SLIP (SYRINGE) IMPLANT
NDL HYPO 25X5/8 SAFETYGLIDE (NEEDLE) IMPLANT
NEEDLE HYPO 22GX1.5 SAFETY (NEEDLE) IMPLANT
NEEDLE HYPO 25X5/8 SAFETYGLIDE (NEEDLE) IMPLANT
NS IRRIG 1000ML POUR BTL (IV SOLUTION) ×3 IMPLANT
PACK C SECTION WH (CUSTOM PROCEDURE TRAY) ×3 IMPLANT
PAD OB MATERNITY 4.3X12.25 (PERSONAL CARE ITEMS) ×3 IMPLANT
PENCIL SMOKE EVAC W/HOLSTER (ELECTROSURGICAL) ×3 IMPLANT
RTRCTR C-SECT PINK 25CM LRG (MISCELLANEOUS) ×3 IMPLANT
SUT CHROMIC 1 CTX 36 (SUTURE) ×6 IMPLANT
SUT CHROMIC 2 0 CT 1 (SUTURE) ×3 IMPLANT
SUT PDS AB 0 CTX 60 (SUTURE) ×3 IMPLANT
SUT VIC AB 2-0 CT1 27 (SUTURE) ×3
SUT VIC AB 2-0 CT1 TAPERPNT 27 (SUTURE) ×1 IMPLANT
SUT VIC AB 4-0 KS 27 (SUTURE) IMPLANT
SYR 30ML LL (SYRINGE) IMPLANT
TOWEL OR 17X24 6PK STRL BLUE (TOWEL DISPOSABLE) ×3 IMPLANT
TRAY FOLEY W/BAG SLVR 14FR LF (SET/KITS/TRAYS/PACK) ×3 IMPLANT

## 2017-11-01 NOTE — Progress Notes (Signed)
9093 pt complains of "not feeling good" HR dropped to 38. Pt pale and diaphoretic. Second RN from  Main PACU to bedside. Pt vomited after dropping heartrate. o2 sat remained 95-97. Pt gasped with lowering of HR. Dr Acey Lav and dr Tenny Craw updated. PT has history of SVT with ablation. EKG has PVC's and PAC's after event.

## 2017-11-01 NOTE — Op Note (Signed)
Pre-Operative Diagnosis: 1) 39+5-week intrauterine pregnancy 2) history of prior cesarean section desires repeat 3) desired permanent sterilization 4) advanced maternal age Postoperative Diagnosis: 1) 39+5-week intrauterine pregnancy 2) history of prior cesarean section desires repeat 3) desired permanent sterilization 4) advanced maternal age Procedure: Repeat low transverse cesarean section and bilateral partial salpingectomy Surgeon: Dr. Waynard Reeds Assistant: Janalyn Shy, RNFA Operative Findings: Vigorous female infant in the vertex presentation with Apgar scores of 8 at 1 minute and 9 at 5 minutes.  Normal-appearing ovaries and tubes.  A 1 cm violaceous cystic mass was noted extending from a colon epiploica.  This was excised and sent to pathology Specimen: Placenta to L&D, epiploic mass to pathology EBL: Total I/O In: 2700 [I.V.:2700] Out: 1294 [Urine:150; Blood:1144]   Procedure:Ms. Donathan is an 47 year old gravida 8 para 1061 at 39 weeks and 5 days estimated gestational age who presents for cesarean section. Following the appropriate informed consent the patient was brought to the operating room where spinal anesthesia was administered and found to be adequate. She was placed in the dorsal supine position with a leftward tilt. She was prepped and draped in the normal sterile fashion.  The patient was appropriately identified during a preoperative timeout procedure.  The scalpel was then used to make a Pfannenstiel skin incision which was carried down to the underlying layers of soft tissue to the fascia. The fascia was incised in the midline and the fascial incision was extended laterally with Mayo scissors. The superior aspect of the fascial incision was grasped with Coker clamps x2, tented up and the rectus muscles dissected off sharply with the electrocautery unit area and the same procedure was repeated on the inferior aspect of the fascial incision. The rectus muscles were separated in  the midline. The abdominal peritoneum was identified, tented up, entered sharply, and the incision was extended superiorly and inferiorly with good visualization of the bladder. The Alexis retractor was then deployed. The vesicouterine peritoneum was identified, tented up, entered sharply, and the bladder flap was created digitally. Scalpel was then used to make a low transverse incision on the uterus which was extended laterally with both blunt dissection and the bandage scissors. The fetal vertex was identified, delivered easily through the uterine incision followed by the body. The infant was bulb suctioned on the operative field cried vigorously.  Following a 1 minute delay in cord clamping the cord was clamped and cut and the infant was passed to the waiting neonatology team. Placenta was then delivered spontaneously, the uterus was cleared of all clot and debris. The uterine incision was repaired with #1 chromic in running locked fashion . Ovaries and tubes were inspected and normal.  Attention was turned to the left fallopian tube.  The fallopian tube was elevated with a Babcock clamp.  A clear space in the mesosalpinx was identified and incised with the electrocautery unit.  2-0 plain gut free ties were used to tie off a proximal and distal portion of the tube x2 and the intervening section of tube was excised and removed.  The same procedure was repeated on the right fallopian tube.  The Alexis retractor was removed. The abdominal peritoneum was reapproximated with 2-0 Vicryl in a running fashion, the rectus muscles was reapproximated with #1 chromic in a running fashion. The fascia was closed with a looped PDS in a running fashion. The skin was closed with 4-0 vicryl in a subcuticular fashion and Dermabond. All sponge lap and needle counts were correct x2. Patient tolerated the  procedure well and recovered in stable condition following the procedure.

## 2017-11-01 NOTE — H&P (Signed)
Destiny [name] is a 47 y.o. female presenting for repeat cesarean section and tubal ligation  47 yo [phone] presents for repeat c/s and tubal ligation. Her pregnancy has been complicated by AMA. OB History    Gravida  8   Para  1   Term  1   Preterm      AB  6   Living  1     SAB  6   TAB      Ectopic      Multiple      Live Births  1          Past Medical History:  Diagnosis Date  . AMA (advanced maternal age) multigravida 35+   . Asthma    exercise induced  . Dysrhythmia    SVT ab;atopm 12/10  . Herpes   . Hypotension   . Hypothyroidism    Past Surgical History:  Procedure Laterality Date  . CARDIAC CATHETERIZATION  approx. 2007   catheter ablation  . CESAREAN SECTION  03/20/2011   Procedure: CESAREAN SECTION;  Surgeon: Freddrick March. Tenny Craw, MD;  Location: [facility];  Service: Gynecology;  Laterality: N/A;  primary cesarean section of baby boy at 0520  APGAR 9/9  . DILATION AND EVACUATION N/A 10/11/2013   Procedure: DILATATION AND EVACUATION;  Surgeon: Loney Laurence, MD;  Location: [facility];  Service: Gynecology;  Laterality: N/A;  . HAND SURGERY    . NASAL SINUS SURGERY  2009   Family History: family history includes Cancer in her mother and paternal grandmother; Dysrhythmia in her maternal aunt; Heart disease in her father and paternal grandfather; Hyperlipidemia in her father; Hypertension in her father and paternal grandfather; Hypothyroidism in her mother, paternal aunt, paternal grandfather, and paternal grandmother; Liver disease in her mother; Stroke in her maternal aunt; Thyroid disease in her mother. Social History:  reports that she has never smoked. She has never used smokeless tobacco. She reports that she does not drink alcohol or use drugs.     Maternal Diabetes: No Genetic Screening: Normal Maternal Ultrasounds/Referrals: Normal Fetal Ultrasounds or other Referrals:  None Maternal Substance Abuse:  No Significant Maternal Medications:   None Significant Maternal Lab Results:  None Other Comments:  None  ROS History   Blood pressure (!) 158/81, pulse 93, temperature (!) 97.5 F (36.4 C), temperature source Oral, height 5\' 5"  (1.651 m), weight 108.1 kg, last menstrual period 01/27/2017, unknown if currently breastfeeding. Exam Physical Exam  Prenatal labs: ABO, Rh: --/--/B POS, B POS Performed at [facility], [address], [place], [place] [address]  [phone]/02 1401) Antibody: NEG (09/02 1401) Rubella: Nonimmune (02/07 0000) RPR: Non Reactive (09/02 1401)  HBsAg: Negative (02/07 0000)  HIV: Non-reactive (02/07 0000)  GBS: Negative (08/07 0000)   Assessment/Plan: 1) Admit 2) SCDs 3) Ancef OCTOR  Waynard Reeds 46/04/2017, 7:13 AM

## 2017-11-01 NOTE — Anesthesia Preprocedure Evaluation (Signed)
Anesthesia Evaluation  Patient identified by MRN, date of birth, ID band Patient awake    Reviewed: Allergy & Precautions, NPO status , Patient's Chart, lab work & pertinent test results  Airway Mallampati: II  TM Distance: >3 FB Neck ROM: Full    Dental no notable dental hx.    Pulmonary asthma ,    Pulmonary exam normal breath sounds clear to auscultation       Cardiovascular Normal cardiovascular exam+ dysrhythmias Supra Ventricular Tachycardia  Rhythm:Regular Rate:Normal     Neuro/Psych negative neurological ROS  negative psych ROS   GI/Hepatic negative GI ROS, Neg liver ROS,   Endo/Other  Hypothyroidism   Renal/GU negative Renal ROS  negative genitourinary   Musculoskeletal negative musculoskeletal ROS (+)   Abdominal   Peds negative pediatric ROS (+)  Hematology negative hematology ROS (+)   Anesthesia Other Findings   Reproductive/Obstetrics (+) Pregnancy                             Anesthesia Physical Anesthesia Plan  ASA: II  Anesthesia Plan: Spinal   Post-op Pain Management:    Induction:   PONV Risk Score and Plan: 3 and Scopolamine patch - Pre-op and Treatment may vary due to age or medical condition  Airway Management Planned: Natural Airway  Additional Equipment:   Intra-op Plan:   Post-operative Plan:   Informed Consent: I have reviewed the patients History and Physical, chart, labs and discussed the procedure including the risks, benefits and alternatives for the proposed anesthesia with the patient or authorized representative who has indicated his/her understanding and acceptance.   Dental advisory given  Plan Discussed with:   Anesthesia Plan Comments:         Anesthesia Quick Evaluation

## 2017-11-01 NOTE — Anesthesia Postprocedure Evaluation (Signed)
Anesthesia Post Note  Patient: Destiny [name]  Procedure(s) Performed: REPEAT CESAREAN SECTION WITH BILATERAL TUBAL LIGATION (Bilateral )     Patient location during evaluation: Mother Baby Anesthesia Type: Spinal Level of consciousness: awake and alert Pain management: pain level controlled Vital Signs Assessment: post-procedure vital signs reviewed and stable Respiratory status: spontaneous breathing, nonlabored ventilation and respiratory function stable Cardiovascular status: stable Postop Assessment: no headache, no backache and epidural receding Anesthetic complications: no    Last Vitals:  Vitals:   11/01/17 1205 11/01/17 1255  BP: 123/60 123/85  Pulse: 78 71  Resp: (!) 24 (!) 22  Temp: (!) 36.3 C 36.4 C  SpO2: 97% 97%    Last Pain:  Vitals:   11/01/17 1255  TempSrc: Oral  PainSc:    Pain Goal:                 [name]

## 2017-11-01 NOTE — Anesthesia Procedure Notes (Signed)
Spinal  Patient location during procedure: OR Staffing Anesthesiologist: Villa Burgin, MD Performed: anesthesiologist  Preanesthetic Checklist Completed: patient identified, site marked, surgical consent, pre-op evaluation, timeout performed, IV checked, risks and benefits discussed and monitors and equipment checked Spinal Block Patient position: sitting Prep: DuraPrep Patient monitoring: heart rate, continuous pulse ox and blood pressure Approach: midline Location: L3-4 Injection technique: single-shot Needle Needle type: Sprotte  Needle gauge: 24 G Needle length: 9 cm Additional Notes Expiration date of kit checked and confirmed. Patient tolerated procedure well, without complications.       

## 2017-11-01 NOTE — Transfer of Care (Signed)
Immediate Anesthesia Transfer of Care Note  Patient: Destiny [name]  Procedure(s) Performed: REPEAT CESAREAN SECTION WITH BILATERAL TUBAL LIGATION (Bilateral )  Patient Location: PACU  Anesthesia Type:Spinal  Level of Consciousness: awake, alert  and oriented  Airway & Oxygen Therapy: Patient Spontanous Breathing  Post-op Assessment: Report given to RN and Post -op Vital signs reviewed and stable  Post vital signs: Reviewed and stable  Last Vitals:  Vitals Value Taken Time  BP    Temp    Pulse    Resp    SpO2      Last Pain:  Vitals:   11/01/17 0605  TempSrc: Oral  PainSc: 0-No pain         Complications: No apparent anesthesia complications

## 2017-11-02 ENCOUNTER — Encounter (HOSPITAL_COMMUNITY): Payer: Self-pay | Admitting: Obstetrics and Gynecology

## 2017-11-02 LAB — CBC
HCT: 28.1 % — ABNORMAL LOW (ref 36.0–46.0)
Hemoglobin: 9.8 g/dL — ABNORMAL LOW (ref 12.0–15.0)
MCH: 31.7 pg (ref 26.0–34.0)
MCHC: 34.9 g/dL (ref 30.0–36.0)
MCV: 90.9 fL (ref 78.0–100.0)
PLATELETS: 193 10*3/uL (ref 150–400)
RBC: 3.09 MIL/uL — ABNORMAL LOW (ref 3.87–5.11)
RDW: 13.5 % (ref 11.5–15.5)
WBC: 12.7 10*3/uL — AB (ref 4.0–10.5)

## 2017-11-02 LAB — BIRTH TISSUE RECOVERY COLLECTION (PLACENTA DONATION)

## 2017-11-02 NOTE — Lactation Note (Addendum)
This note was copied from a baby's chart. Lactation Consultation Note  Patient Name: Destiny [name]'T Date: 11/02/2017 Reason for consult: Initial assessment;Term;Maternal endocrine disorder Type of Endocrine Disorder?: Thyroid P2, LGA-9 lbs 5 ounces, Hypothyroidism, c/s delivery. Per mom, infant had 2 soiled and one wet diaper. Mom attempted to latch but infant to sleepy at this time. Per mom infant latched at 4 am for 20 mins.  Mom mention her son had tongue tie and she feels infant does after looking at infant's mouth. LC advised mom to discuss her concerns with infant's pediatrician.  Mom will BF, then pump every 3 hrs and hand  express and give back infant  EBM. Mom encouraged to feed baby 8-12 times/24 hours and with feeding cues.  Reviewed Baby & Me book's Breastfeeding Basics.  Mom made aware of O/P services, breastfeeding support groups, community resources, and our phone # for post-discharge questions.      Maternal Data Formula Feeding for Exclusion: No Has patient been taught Hand Expression?: Yes Does the patient have breastfeeding experience prior to this delivery?: Yes  Feeding Feeding Type: Breast Fed Length of feed: 10 min  LATCH Score Latch: Too sleepy or reluctant, no latch achieved, no sucking elicited.  Audible Swallowing: Spontaneous and intermittent  Type of Nipple: Everted at rest and after stimulation  Comfort (Breast/Nipple): Soft / non-tender  Hold (Positioning): Assistance needed to correctly position infant at breast and maintain latch.  LATCH Score: 7  Interventions Interventions: Breast feeding basics reviewed;DEBP;Hand express;Breast massage;Skin to skin;Assisted with latch;Support pillows;Position options  Lactation Tools Discussed/Used WIC Program: No(Over income)   Consult Status Consult Status: Follow-up Date: 11/02/17 Follow-up type: In-patient    Destiny [name] 11/02/2017, 5:35 AM

## 2017-11-02 NOTE — Lactation Note (Signed)
This note was copied from a baby's chart. Lactation Consultation Note  Patient Name: Destiny [name] [id number]'L Date: 11/02/2017 Reason for consult: Follow-up assessment  Baby Destiny 64 hours old.  Mom reports she spoke with peditrician this am about infants mouth and she definitely has a tongue tie.  Mom reports was told to follow up with Dr. Stevphen [place] or something like that.  Gave mom resource list for tongue tie release providers in the area and breastfeeding resource list for infants with tongue tie.  Mom reports first baby had lip tie and nipples were sore.  Reports they did not do anything about it.  Felt it would be outgrown.  Mom reports they did not look for lip tie on her.  Dad and infant STS post bath. Mom reports nipples starting to get a little sore. Urged hand expression and rub EBM on nipples and then let them air dry. Mom reports she is really not able to hand express much at this time.  Urged her to keep practicing.  Offered to get coconut oil for her nipples.  She reported she would let us know. Urged mom to pump past breastfeedings and feed back all EBM to infant.  Discussed alternated feeding methods.  Mom has bottles in room.  Told mom about other feeding methods and explained why it was better to start with those with breastfeeding in the beginning. Mom reports she is starting to get a little better with breastfeeding. Urged mom to call for breastfeeding observation.  Praised bf efforts. Told mom we would follow up with her tomorrow.   Maternal Data    Feeding    LATCH Score                   Interventions Interventions: Breast feeding basics reviewed  Lactation Tools Discussed/Used     Consult Status Consult Status: Follow-up Date: 11/03/17 Follow-up type: In-patient    [facility] Michaelle Copas 11/02/2017, 12:46 PM

## 2017-11-02 NOTE — Progress Notes (Signed)
Subjective: Postpartum Day 1: Cesarean Delivery Patient reports pain controlled, resolution of nausea and vomiting  Objective: Vital signs in last 24 hours: Temp:  [97.7 F (36.5 C)-98.4 F (36.9 C)] 97.8 F (36.6 C) (09/04 1553) Pulse Rate:  [79-100] 100 (09/04 1553) Resp:  [16-18] 18 (09/04 1553) BP: (110-135)/(57-77) 118/63 (09/04 1553) SpO2:  [96 %-98 %] 97 % (09/04 1553)  Physical Exam:  General: alert, cooperative and appears stated age Lochia: appropriate Uterine Fundus: firm Incision: healing well DVT Evaluation: No evidence of DVT seen on physical exam.  Recent Labs    11/01/17 0950 11/02/17 0527  HGB 11.9* 9.8*  HCT 34.6* 28.1*    Assessment/Plan: Status post Cesarean section. Doing well postoperatively.  Continue current care Rare PVCs on tele. No more Brady episodes. Will D/C tele.  Waynard Reeds 11/02/2017, 8:15 PM

## 2017-11-03 MED ORDER — OXYCODONE-ACETAMINOPHEN 5-325 MG PO TABS: 1.0000 | ORAL_TABLET | ORAL | 0 refills | Status: DC | PRN

## 2017-11-03 MED ORDER — MEASLES, MUMPS & RUBELLA VAC ~~LOC~~ INJ
0.5000 mL | INJECTION | Freq: Once | SUBCUTANEOUS | Status: AC
  Administered 2017-11-03: 0.5 mL via SUBCUTANEOUS
  Filled 2017-11-03: qty 0.5

## 2017-11-03 NOTE — Progress Notes (Addendum)
  Patient is eating, ambulating, voiding.  Pain control is good.  Vitals:   11/02/17 1553 11/02/17 2031 11/02/17 2350 11/03/17 0427  BP: 118/63 130/65 138/70 134/75  Pulse: 100 (!) 103 (!) 102 86  Resp: _0 Temp: 97.8 F (36.6 C) 98.2 F (36.8 C) 97.6 F (36.4 C) 98.2 F (36.8 C)  TempSrc: Oral Oral    SpO2: 97% 96% 96% 96%  Weight:      Height:        lungs:   clear to auscultation cor:    RRR Abdomen:  soft, appropriate tenderness, incisions intact and without erythema or exudate ex:    no cords   Lab Results  Component Value Date   WBC 12.7 (H) 11/02/2017   HGB 9.8 (L) 11/02/2017   HCT 28.1 (L) 11/02/2017   MCV 90.9 11/02/2017   PLT 193 11/02/2017    --/--/B POS Performed at [facility], [address] [place], [place] [address]  (09/03 0707)/R NON immune.  A/P    Post operative day 2.  Routine post op and postpartum care.  Expect d/c today.  Percocet for pain control. MMR before d/c.  Pt's heart rate has been stable and Tele was d/ced.  Pt feeling better.  Needs to F/U with PCP.

## 2017-11-03 NOTE — Lactation Note (Signed)
This note was copied from a baby's chart. Lactation Consultation Note  Patient Name: Destiny [name] TJQZE'S Date: 11/03/2017    Visited with P2 Mom and FOB on day of discharge.  Baby 51 hrs old, at 5% weight loss.  3 stools and 1 void last 24 hrs.    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 Lexine Baton Pediatric Dentist.  Pecola Leisure latched in cross cradle hold when [facility] came in room.  Baby had been on both breasts for over 30 mins.  Mom using good technique.  Straightened baby's neck to ensure chin in closer to breast.  Lips flanged, but baby non-nutritive currently.  Mom states baby had been more vigorous and swallows heard earlier.  When baby came off the breast, nipple looked rounded and erect.  Both nipples pink, Mom states latching is painful.  Talked about double pumping.  Mom hasn't been pumping.  Has an [employer] DEBP at home.  Talked about having extra stimulation to breasts to ensure a full milk supply, and offering baby her EBM until it is determined baby can transfer adequate milk from her breast.  Mom understands.  Plan- 1- Keep baby STS as much as possible 2- Breastfeed when baby cues, goal is 8-12 feedings per 24 hrs. 3- Pump both breasts after every/every other feeding at breast and offer baby her EBM  4- Recommend breast massage and hand expression to express more EBM for baby. 5- Call prn for assistance. 6- Follow-up with OP lactation Monday (if available), request sent.    [name] 11/03/2017, 11:17 AM

## 2017-11-03 NOTE — Discharge Summary (Signed)
Obstetric Discharge Summary Reason for Admission: cesarean section Prenatal Procedures: NST Intrapartum Procedures: cesarean: low cervical, transverse Postpartum Procedures: P.P. tubal ligation Complications-Operative and Postpartum: none Hemoglobin  Date Value Ref Range Status  11/02/2017 9.8 (L) 12.0 - 15.0 g/dL Final   HCT  Date Value Ref Range Status  11/02/2017 28.1 (L) 36.0 - 46.0 % Final    Discharge Diagnoses: Term Pregnancy-delivered  Discharge Information: Date: 11/03/2017 Activity: pelvic rest Diet: routine Medications: Iron and Percocet Condition: stable Instructions: refer to practice specific booklet Discharge to: home Follow-up Information    Waynard Reeds, MD Follow up in 4 week(s).   Specialty:  Obstetrics and Gynecology Contact information: [address] [place] [address] [phone]           Newborn Data: Live born female  Birth Weight: 9 lb 5.9 oz (4250 g) APGAR: 8, 8  Newborn Delivery   Birth date/time:  11/01/2017 07:55:00 Delivery type:  C-Section, Low Transverse Trial of labor:  No C-section categorization:  Repeat     Home with mother.  Destiny [name] A 11/03/2017, 8:04 AM

## 2017-11-04 NOTE — Lactation Note (Signed)
This note was copied from a baby's chart. Lactation Consultation Note Baby had 3 low blood sugars this evening. Baby received glucose gel, then a normal glucose. Just repeated glucose, waiting on results.  Mom has large pendulous breast w/everted nipples. Breast filling w/knots noted. Some tender. Mom stated has fibroids as well.  Baby last BF to Lt. Breast, a little softer than the Rt. Latched baby to Rt. Breast. Baby obtained good latch, appeared to swallow occasionally.  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 D/t hypoglycemia, LC concerned of inadequate transfer.   LC fitted mom w/#20 NS. Fit well. Taught mom application. Mom demonstrated. Inserted colostrum into NS w/curve tip syring, latched baby in football position. Baby fed well. Mom stated comfort during feeding. Taught "C" hold for latching, occasional massage. Assess breast before and after feeding for transfer. LC noted significant softening of breast after feeding.  Tried #24 NS, to large.   After feeding breast soft and BF for 30 min., mom pumped opposite breast.   Feeding plan: BF using NS d/t tongue tie until fixed or LC consult says no longer needed. Give baby colostrum/BM to equal amount needed to supplement until mature milk comes in. Give formula to equal amount needed.  Post-pump for milk to be given for next feeding.  Baby has a consultation appt. To be seen by a dentist for tongue tie. Mom hopes for d/c today. Glucose came back 45. Parents worried may drop again.  While LC charting, FOB came to desk asking LC to come back to see mom d/t she was pumping and not getting anything. Lc showed mom a little was in flange. Mom stated she had been pumping 12 min. Mom was massaging breast. LC discussed milk production and pumping. Massaged a few minutes to assist in showing mom colostrum. Encouraged parents to relax, pump and supplement as needed.  Patient Name: Girl Chenita Woliver KLKJZ'P Date: 11/04/2017 Reason for consult: Follow-up  assessment;Other (Comment)(hypoglycemia) Type of Endocrine Disorder?: Thyroid   Maternal Data    Feeding Feeding Type: Breast Fed Length of feed: 30 min  LATCH Score Latch: Grasps breast easily, tongue down, lips flanged, rhythmical sucking.  Audible Swallowing: Spontaneous and intermittent  Type of Nipple: Everted at rest and after stimulation  Comfort (Breast/Nipple): Filling, red/small blisters or bruises, mild/mod discomfort  Hold (Positioning): Assistance needed to correctly position infant at breast and maintain latch.  LATCH Score: 8  Interventions Interventions: Breast feeding basics reviewed;Support pillows;Assisted with latch;Position options;Skin to skin;Expressed milk;Breast massage;Hand express;DEBP;Breast compression;Adjust position  Lactation Tools Discussed/Used Tools: Pump;Nipple Shields Nipple shield size: 20 Breast pump type: Double-Electric Breast Pump Pump Review: Setup, frequency, and cleaning;Milk Storage Initiated by:: RN Date initiated:: 11/04/17   Consult Status Consult Status: Follow-up Date: 11/04/17 Follow-up type: In-patient    Kathaleen Dudziak, Diamond Nickel 11/04/2017, 12:07 AM

## 2017-11-04 NOTE — Progress Notes (Signed)
Discharge instructions given to patient.  She was instructed about post partum depression and post partum care.  She has her baby and me booklet for reference. Baby is in NICU.  Mom walked out by Antonito NT.

## 2017-12-09 DIAGNOSIS — Z124 Encounter for screening for malignant neoplasm of cervix: Secondary | ICD-10-CM | POA: Diagnosis not present

## 2017-12-09 DIAGNOSIS — Z6834 Body mass index (BMI) 34.0-34.9, adult: Secondary | ICD-10-CM | POA: Diagnosis not present

## 2017-12-20 DIAGNOSIS — E559 Vitamin D deficiency, unspecified: Secondary | ICD-10-CM | POA: Diagnosis not present

## 2017-12-20 DIAGNOSIS — E538 Deficiency of other specified B group vitamins: Secondary | ICD-10-CM | POA: Diagnosis not present

## 2017-12-20 DIAGNOSIS — R5383 Other fatigue: Secondary | ICD-10-CM | POA: Diagnosis not present

## 2017-12-20 DIAGNOSIS — E039 Hypothyroidism, unspecified: Secondary | ICD-10-CM | POA: Diagnosis not present

## 2018-01-02 DIAGNOSIS — R7989 Other specified abnormal findings of blood chemistry: Secondary | ICD-10-CM | POA: Diagnosis not present

## 2018-01-02 DIAGNOSIS — R5383 Other fatigue: Secondary | ICD-10-CM | POA: Diagnosis not present

## 2018-01-02 DIAGNOSIS — E538 Deficiency of other specified B group vitamins: Secondary | ICD-10-CM | POA: Diagnosis not present

## 2018-01-02 DIAGNOSIS — R790 Abnormal level of blood mineral: Secondary | ICD-10-CM | POA: Diagnosis not present

## 2018-01-26 ENCOUNTER — Encounter (HOSPITAL_COMMUNITY): Payer: Self-pay

## 2018-04-27 DIAGNOSIS — E538 Deficiency of other specified B group vitamins: Secondary | ICD-10-CM | POA: Diagnosis not present

## 2018-04-27 DIAGNOSIS — E039 Hypothyroidism, unspecified: Secondary | ICD-10-CM | POA: Diagnosis not present

## 2018-04-27 DIAGNOSIS — E559 Vitamin D deficiency, unspecified: Secondary | ICD-10-CM | POA: Diagnosis not present

## 2018-04-27 DIAGNOSIS — R790 Abnormal level of blood mineral: Secondary | ICD-10-CM | POA: Diagnosis not present

## 2018-04-27 DIAGNOSIS — R7989 Other specified abnormal findings of blood chemistry: Secondary | ICD-10-CM | POA: Diagnosis not present

## 2018-04-27 DIAGNOSIS — R5383 Other fatigue: Secondary | ICD-10-CM | POA: Diagnosis not present

## 2018-05-10 DIAGNOSIS — E538 Deficiency of other specified B group vitamins: Secondary | ICD-10-CM | POA: Diagnosis not present

## 2018-05-10 DIAGNOSIS — E559 Vitamin D deficiency, unspecified: Secondary | ICD-10-CM | POA: Diagnosis not present

## 2018-05-10 DIAGNOSIS — R5383 Other fatigue: Secondary | ICD-10-CM | POA: Diagnosis not present

## 2018-05-10 DIAGNOSIS — E039 Hypothyroidism, unspecified: Secondary | ICD-10-CM | POA: Diagnosis not present

## 2018-05-12 DIAGNOSIS — H524 Presbyopia: Secondary | ICD-10-CM | POA: Diagnosis not present

## 2018-05-12 DIAGNOSIS — H52203 Unspecified astigmatism, bilateral: Secondary | ICD-10-CM | POA: Diagnosis not present

## 2018-05-12 DIAGNOSIS — H43813 Vitreous degeneration, bilateral: Secondary | ICD-10-CM | POA: Diagnosis not present

## 2018-08-29 DIAGNOSIS — R5383 Other fatigue: Secondary | ICD-10-CM | POA: Diagnosis not present

## 2018-08-29 DIAGNOSIS — E039 Hypothyroidism, unspecified: Secondary | ICD-10-CM | POA: Diagnosis not present

## 2018-08-29 DIAGNOSIS — E559 Vitamin D deficiency, unspecified: Secondary | ICD-10-CM | POA: Diagnosis not present

## 2018-08-29 DIAGNOSIS — R790 Abnormal level of blood mineral: Secondary | ICD-10-CM | POA: Diagnosis not present

## 2018-08-29 DIAGNOSIS — E538 Deficiency of other specified B group vitamins: Secondary | ICD-10-CM | POA: Diagnosis not present

## 2018-08-29 DIAGNOSIS — R7989 Other specified abnormal findings of blood chemistry: Secondary | ICD-10-CM | POA: Diagnosis not present

## 2018-09-12 DIAGNOSIS — R790 Abnormal level of blood mineral: Secondary | ICD-10-CM | POA: Diagnosis not present

## 2018-09-12 DIAGNOSIS — E538 Deficiency of other specified B group vitamins: Secondary | ICD-10-CM | POA: Diagnosis not present

## 2018-09-12 DIAGNOSIS — R7989 Other specified abnormal findings of blood chemistry: Secondary | ICD-10-CM | POA: Diagnosis not present

## 2018-09-12 DIAGNOSIS — R5383 Other fatigue: Secondary | ICD-10-CM | POA: Diagnosis not present

## 2018-12-21 ENCOUNTER — Telehealth: Payer: Federal, State, Local not specified - PPO | Admitting: Physician Assistant

## 2018-12-21 ENCOUNTER — Other Ambulatory Visit: Payer: Self-pay

## 2018-12-21 DIAGNOSIS — R432 Parageusia: Secondary | ICD-10-CM | POA: Diagnosis not present

## 2018-12-21 DIAGNOSIS — Z20822 Contact with and (suspected) exposure to covid-19: Secondary | ICD-10-CM

## 2018-12-21 DIAGNOSIS — R059 Cough, unspecified: Secondary | ICD-10-CM

## 2018-12-21 DIAGNOSIS — Z20828 Contact with and (suspected) exposure to other viral communicable diseases: Secondary | ICD-10-CM | POA: Diagnosis not present

## 2018-12-21 DIAGNOSIS — R05 Cough: Secondary | ICD-10-CM | POA: Diagnosis not present

## 2018-12-21 MED ORDER — BENZONATATE 100 MG PO CAPS: 100.0000 mg | ORAL_CAPSULE | Freq: Three times a day (TID) | ORAL | 0 refills | Status: DC | PRN

## 2018-12-21 MED ORDER — ALBUTEROL SULFATE HFA 108 (90 BASE) MCG/ACT IN AERS: 2.0000 | INHALATION_SPRAY | Freq: Four times a day (QID) | RESPIRATORY_TRACT | 0 refills | Status: DC | PRN

## 2018-12-21 NOTE — Progress Notes (Signed)
E-Visit for Corona Virus Screening   Your current symptoms could be consistent with the coronavirus.  Many health care providers can now test patients at their office but not all are.  [facility] has multiple testing sites. For information on our COVID testing locations and hours go to [email or web address]  Please quarantine yourself while awaiting your test results.  We are enrolling you in our [facility] for [facility] . Daily you will receive a questionnaire within the [facility] website. Our COVID 19 response team willl be monitoriing your responses daily.  I have placed an order for the COVID test to be done if you chosse  COVID-19 is a respiratory illness with symptoms that are similar to the flu. Symptoms are typically mild to moderate, but there have been cases of severe illness and death due to the virus. The following symptoms may appear 2-14 days after exposure: . Fever . Cough . Shortness of breath or difficulty breathing . Chills . Repeated shaking with chills . Muscle pain . Headache . Sore throat . New loss of taste or smell . Fatigue . Congestion or runny nose . Nausea or vomiting . Diarrhea  It is vitally important that if you feel that you have an infection such as this virus or any other virus that you stay home and away from places where you may spread it to others.  You should self-quarantine for 14 days if you have symptoms that could potentially be coronavirus or have been in close contact a with a person diagnosed with COVID-19 within the last 2 weeks. You should avoid contact with people age 40 and older.   You should wear a mask or cloth face covering over your nose and mouth if you must be around other people or animals, including pets (even at home). Try to stay at least 6 feet away from other people. This will protect the people around you.  You can use medication such as A prescription cough medication called Tessalon Perles  100 mg. You may take 1-2 capsules every 8 hours as needed for cough and A prescription inhaler called Albuterol MDI 90 mcg /actuation 2 puffs every 4 hours as needed for shortness of breath, wheezing, cough  You may also take acetaminophen (Tylenol) as needed for fever.   Reduce your risk of any infection by using the same precautions used for avoiding the common cold or flu:  Marland Kitchen Wash your hands often with soap and warm water for at least 20 seconds.  If soap and water are not readily available, use an alcohol-based hand sanitizer with at least 60% alcohol.  . If coughing or sneezing, cover your mouth and nose by coughing or sneezing into the elbow areas of your shirt or coat, into a tissue or into your sleeve (not your hands). . Avoid shaking hands with others and consider head nods or verbal greetings only. . Avoid touching your eyes, nose, or mouth with unwashed hands.  . Avoid close contact with people who are sick. . Avoid places or events with large numbers of people in one location, like concerts or sporting events. . Carefully consider travel plans you have or are making. . If you are planning any travel outside or inside the [place], visit the CDC's Travelers' Health webpage for the latest health notices. . If you have some symptoms but not all symptoms, continue to monitor at home and seek medical attention if your symptoms worsen. . If you are having a medical emergency, call 911.  HOME CARE . Only take medications as instructed by your medical team. . Drink plenty of fluids and get plenty of rest. . A steam or ultrasonic humidifier can help if you have congestion.   GET HELP RIGHT AWAY IF YOU HAVE EMERGENCY WARNING SIGNS** FOR COVID-19. If you or someone is showing any of these signs seek emergency medical care immediately. Call 911 or proceed to your closest emergency facility if: . You develop worsening high fever. . Trouble breathing . Bluish lips or face . Persistent pain or  pressure in the chest . New confusion . Inability to wake or stay awake . You cough up blood. . Your symptoms become more severe  **This list is not all possible symptoms. Contact your medical provider for any symptoms that are sever or concerning to you.   MAKE SURE YOU   Understand these instructions.  Will watch your condition.  Will get help right away if you are not doing well or get worse.  Your e-visit answers were reviewed by a board certified advanced clinical practitioner to complete your personal care plan.  Depending on the condition, your plan could have included both over the counter or prescription medications.  If there is a problem please reply once you have received a response from your provider.  Your safety is important to [place].  If you have drug allergies check your prescription carefully.    You can use MyChart to ask questions about today's visit, request a non-urgent call back, or ask for a work or school excuse for 24 hours related to this e-Visit. If it has been greater than 24 hours you will need to follow up with your provider, or enter a new e-Visit to address those concerns. You will get an e-mail in the next two days asking about your experience.  I hope that your e-visit has been valuable and will speed your recovery. Thank you for using e-visits.   Prudy Feeler PA-C  Approximately 5 minutes was spent documenting and reviewing patient's chart.

## 2018-12-22 ENCOUNTER — Encounter (INDEPENDENT_AMBULATORY_CARE_PROVIDER_SITE_OTHER): Payer: Self-pay

## 2018-12-23 ENCOUNTER — Encounter (INDEPENDENT_AMBULATORY_CARE_PROVIDER_SITE_OTHER): Payer: Self-pay

## 2018-12-23 LAB — NOVEL CORONAVIRUS, NAA: SARS-CoV-2, NAA: DETECTED — AB

## 2018-12-24 ENCOUNTER — Encounter (INDEPENDENT_AMBULATORY_CARE_PROVIDER_SITE_OTHER): Payer: Self-pay

## 2018-12-25 ENCOUNTER — Encounter (INDEPENDENT_AMBULATORY_CARE_PROVIDER_SITE_OTHER): Payer: Self-pay

## 2018-12-26 ENCOUNTER — Encounter (INDEPENDENT_AMBULATORY_CARE_PROVIDER_SITE_OTHER): Payer: Self-pay

## 2018-12-27 ENCOUNTER — Encounter (INDEPENDENT_AMBULATORY_CARE_PROVIDER_SITE_OTHER): Payer: Self-pay

## 2018-12-28 ENCOUNTER — Encounter (INDEPENDENT_AMBULATORY_CARE_PROVIDER_SITE_OTHER): Payer: Self-pay

## 2018-12-29 ENCOUNTER — Encounter (INDEPENDENT_AMBULATORY_CARE_PROVIDER_SITE_OTHER): Payer: Self-pay

## 2018-12-31 ENCOUNTER — Encounter (INDEPENDENT_AMBULATORY_CARE_PROVIDER_SITE_OTHER): Payer: Self-pay

## 2019-01-01 ENCOUNTER — Encounter (INDEPENDENT_AMBULATORY_CARE_PROVIDER_SITE_OTHER): Payer: Self-pay

## 2019-03-07 DIAGNOSIS — Z01419 Encounter for gynecological examination (general) (routine) without abnormal findings: Secondary | ICD-10-CM | POA: Diagnosis not present

## 2019-04-03 DIAGNOSIS — E559 Vitamin D deficiency, unspecified: Secondary | ICD-10-CM | POA: Diagnosis not present

## 2019-04-03 DIAGNOSIS — R790 Abnormal level of blood mineral: Secondary | ICD-10-CM | POA: Diagnosis not present

## 2019-04-03 DIAGNOSIS — R7989 Other specified abnormal findings of blood chemistry: Secondary | ICD-10-CM | POA: Diagnosis not present

## 2019-04-03 DIAGNOSIS — E039 Hypothyroidism, unspecified: Secondary | ICD-10-CM | POA: Diagnosis not present

## 2019-04-03 DIAGNOSIS — R5383 Other fatigue: Secondary | ICD-10-CM | POA: Diagnosis not present

## 2019-04-03 DIAGNOSIS — E538 Deficiency of other specified B group vitamins: Secondary | ICD-10-CM | POA: Diagnosis not present

## 2019-04-10 DIAGNOSIS — R5383 Other fatigue: Secondary | ICD-10-CM | POA: Diagnosis not present

## 2019-04-10 DIAGNOSIS — E039 Hypothyroidism, unspecified: Secondary | ICD-10-CM | POA: Diagnosis not present

## 2019-04-10 DIAGNOSIS — R790 Abnormal level of blood mineral: Secondary | ICD-10-CM | POA: Diagnosis not present

## 2019-04-10 DIAGNOSIS — R7989 Other specified abnormal findings of blood chemistry: Secondary | ICD-10-CM | POA: Diagnosis not present

## 2019-07-05 DIAGNOSIS — L718 Other rosacea: Secondary | ICD-10-CM | POA: Diagnosis not present

## 2019-07-05 DIAGNOSIS — L304 Erythema intertrigo: Secondary | ICD-10-CM | POA: Diagnosis not present

## 2019-07-05 DIAGNOSIS — L821 Other seborrheic keratosis: Secondary | ICD-10-CM | POA: Diagnosis not present

## 2019-10-04 ENCOUNTER — Ambulatory Visit (INDEPENDENT_AMBULATORY_CARE_PROVIDER_SITE_OTHER): Payer: Federal, State, Local not specified - PPO | Admitting: Family Medicine

## 2019-10-04 ENCOUNTER — Encounter (INDEPENDENT_AMBULATORY_CARE_PROVIDER_SITE_OTHER): Payer: Self-pay | Admitting: Family Medicine

## 2019-10-04 ENCOUNTER — Other Ambulatory Visit: Payer: Self-pay

## 2019-10-04 VITALS — BP 103/66 | HR 78 | Temp 98.0°F | Ht 65.0 in | Wt 229.0 lb

## 2019-10-04 DIAGNOSIS — Z6838 Body mass index (BMI) 38.0-38.9, adult: Secondary | ICD-10-CM

## 2019-10-04 DIAGNOSIS — E559 Vitamin D deficiency, unspecified: Secondary | ICD-10-CM

## 2019-10-04 DIAGNOSIS — R0602 Shortness of breath: Secondary | ICD-10-CM

## 2019-10-04 DIAGNOSIS — D508 Other iron deficiency anemias: Secondary | ICD-10-CM | POA: Diagnosis not present

## 2019-10-04 DIAGNOSIS — R5383 Other fatigue: Secondary | ICD-10-CM

## 2019-10-04 DIAGNOSIS — F3289 Other specified depressive episodes: Secondary | ICD-10-CM

## 2019-10-04 DIAGNOSIS — Z9189 Other specified personal risk factors, not elsewhere classified: Secondary | ICD-10-CM

## 2019-10-04 DIAGNOSIS — E038 Other specified hypothyroidism: Secondary | ICD-10-CM

## 2019-10-04 DIAGNOSIS — Z0289 Encounter for other administrative examinations: Secondary | ICD-10-CM

## 2019-10-04 NOTE — Progress Notes (Signed)
Office: [phone]  /  Fax: [phone]    Date: October 11, 2019   Appointment Start Time: 3:00pm Duration: 32 minutes Provider: Lawerance CruelGaytri Lashunta Frieden, Psy.D. Type of Session: Intake for Individual Therapy  Location of Patient: Home Location of Provider: Provider's Home Type of Contact: Telepsychological Visit via MyChart Video Visit  Informed Consent: Prior to proceeding with today's appointment, two pieces of identifying information were obtained. In addition, Destiny Destiny [name] physical location at the time of this appointment Destiny [name] obtained as well a phone number she could be reached at in the event of technical difficulties. Destiny Destiny [name] and this provider participated in today's telepsychological service.   The provider's role Destiny [name] explained to [employer] Destiny [name]. The provider reviewed and discussed issues of confidentiality, privacy, and limits therein (e.g., reporting obligations). In addition to verbal informed consent, written informed consent for psychological services Destiny [name] obtained prior to the initial appointment. Since the clinic is not a 24/7 crisis center, mental health emergency resources were shared and this  provider explained MyChart, e-mail, voicemail, and/or other messaging systems should be utilized only for non-emergency reasons. This provider also explained that information obtained during appointments will be placed in Destiny Destiny [name]'s medical record and relevant information will be shared with other providers at [facility] for coordination of care. Moreover, Destiny Destiny [name] agreed information may be shared with other [facility] providers as needed for coordination of care. By signing the service agreement document, Destiny Destiny [name] provided written consent for coordination of care. Prior to initiating telepsychological services, Destiny Destiny [name] completed an informed consent document, which included the development of a safety plan (i.e., an emergency contact, nearest emergency room, and emergency resources)  in the event of an emergency/crisis. Destiny Destiny [name] expressed understanding of the rationale of the safety plan. Destiny Destiny [name] verbally acknowledged understanding she is ultimately responsible for understanding her insurance benefits for telepsychological and in-person services. This provider also reviewed confidentiality, as it relates to telepsychological services, as well as the rationale for telepsychological services (i.e., to reduce exposure risk to COVID-19). Destiny Destiny [name]  acknowledged understanding that appointments cannot be recorded without both party consent and she is aware she is responsible for securing confidentiality on her end of the session. Destiny Destiny [name] verbally consented to proceed.  Chief Complaint/HPI: Destiny Destiny [name] referred by Dr. Thomasene Loteborah Opalski due to other depression, with emotional eating. Per the note for the initial visit with Dr. Thomasene Loteborah Opalski on October 04, 2019, "Positive stress as well.  Destiny Destiny [name] is struggling with emotional eating and using food for comfort to the extent that it is negatively impacting her health. She has been working on behavior modification techniques to help reduce her emotional eating and has been unsuccessful. She shows no sign of suicidal or homicidal ideations." The note for the initial appointment with Dr. Thomasene Loteborah Opalski  indicated the following: "Destiny [name]'s habits were reviewed today and are as follows: Her family eats meals together, she thinks her family will eat healthier with her, her desired weight loss is 86 pounds, she started gaining weight over the last few years, her heaviest weight ever Destiny [name] 237 pounds, she craves cheese, chips, pasta, rice, sweets, and fries, she snacks frequently in the evenings, she skips breakfast frequently, she frequently makes poor food choices, she frequently eats larger portions than normal and she struggles with emotional eating." Destiny Destiny [name] Food and Mood (modified PHQ-9) score on October 04, 2019  Destiny [name] 11.  During today's appointment, Destiny Destiny [name]  verbally administered a questionnaire assessing various behaviors related to emotional eating. Destiny Destiny [name] endorsed the following: overeat when you  are celebrating, eat certain foods when you are anxious, stressed, depressed, or your feelings are hurt, use food to help you cope with emotional situations, find food is comforting to you, overeat when you are worried about something, overeat frequently when you are bored or lonely, not worry about what you eat when you are in a good mood, overeat when you are alone, but eat much less when you are with other people, eat to help you stay awake and eat as a reward. Destiny Destiny [name] believes the onset of emotional eating Destiny [name] likely 8-10 years secondary to miscarriages. She described the current frequency of emotional eating as "between few times a week to daily." In addition, Destiny [name] endorsed a history of binge eating behaviors, noting the frequency as "every now and then." She indicated she last engaged in binge eating behaviors a couple months ago (e.g., a bag of chips followed by another snack). Destiny [name] denied a history of restricting food intake and engagement in other compensatory strategies; however, she reported a history of purging ("less than 5 times a years") after engaging in binge eating behaviors. She could not recall the last time she purged. She stated she has never been diagnosed with an eating disorder. She also denied a history of treatment for emotional eating. Moreover, Destiny Destiny [name] indicated being extremely tired triggers emotional eating, whereas staying busy makes emotional eating better. Furthermore, Destiny Destiny [name] reported ongoing worry about her daughter's well-being due to medical concerns resulting in multiple doctor's visits.   Mental Status Examination:  Appearance: well groomed and appropriate hygiene  Behavior: appropriate to circumstances Mood: euthymic Affect: mood congruent Speech: normal in rate, volume, and tone Eye Contact: appropriate Psychomotor Activity:  appropriate Gait: unable to assess Thought Process: linear, logical, and goal directed  Thought Content/Perception: denies suicidal and homicidal ideation, plan, and intent and no hallucinations, delusions, bizarre thinking or behavior reported or observed Orientation: time, person, place, and purpose of appointment Memory/Concentration: memory, attention, language, and fund of knowledge intact  Insight/Judgment: good  Family & Psychosocial History: Aprel reported she is married and she has two children (ages 68 and 1). 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 Additionally, Kharma shared her highest level of education obtained is a master's degree. Currently, Geralyn's social support system consists of her mother, friends, and best friend. Moreover, Lacee stated she resides with her husband, children, and two dogs.   Medical History:  Past Medical History:  Diagnosis Date  . AMA (advanced maternal age) multigravida 35+   . Anemia   . Asthma    exercise induced  . Back pain   . Dysrhythmia    SVT ab;atopm 12/10  . Herpes   . Hypotension   . Hypothyroidism   . Joint pain   . SVT (supraventricular tachycardia) (HCC)   . Swelling of both lower extremities   . Vitamin D deficiency    Past Surgical History:  Procedure Laterality Date  . CARDIAC CATHETERIZATION  approx. 2007   catheter ablation  . CESAREAN SECTION  03/20/2011   Procedure: CESAREAN SECTION;  Surgeon: Freddrick March. Tenny Craw, MD;  Location: [facility];  Service: Gynecology;  Laterality: N/A;  primary cesarean section of baby boy at 0520  APGAR 9/9  . CESAREAN SECTION WITH BILATERAL TUBAL LIGATION Bilateral 11/01/2017   Procedure: REPEAT CESAREAN SECTION WITH BILATERAL TUBAL LIGATION;  Surgeon: Waynard Reeds, MD;  Location: [facility];  Service: Obstetrics;  Laterality: Bilateral;  Heather,RNFA  . DILATION AND EVACUATION N/A 10/11/2013   Procedure: DILATATION  AND EVACUATION;  Surgeon: Loney Laurence, MD;  Location: [facility]  [facility];  Service: Gynecology;  Laterality: N/A;  . HAND SURGERY    . NASAL SINUS SURGERY  2009  . SVT ABLATION  11/2007   Current Outpatient Medications on File Prior to Visit  Medication Sig Dispense Refill  . albuterol (VENTOLIN HFA) 108 (90 Base) MCG/ACT inhaler Inhale 2 puffs into the lungs every 6 (six) hours as needed for wheezing or shortness of breath. 18 g 0  . ferrous sulfate 325 (65 FE) MG EC tablet Take 325 mg by mouth daily with breakfast.    . MAGNESIUM CARBONATE PO Take 1,000 mg by mouth daily.    Marland Kitchen thyroid (ARMOUR THYROID) 120 MG tablet Take 120 mg by mouth daily before breakfast.    . vitamin C (ASCORBIC ACID) 500 MG tablet Take 500 mg by mouth daily.    Marland Kitchen VITAMIN D, CHOLECALCIFEROL, PO Take 7,500 Units by mouth.    Marland Kitchen VITAMIN K PO Take 150 mcg by mouth daily.    . Zinc Acetate, Oral, (ZINC ACETATE PO) Take by mouth.     No current facility-administered medications on file prior to visit.   Mental Health History: Solyana reported a history of couple's counseling years ago. She denied a history of psychotropic medications. Anandi reported there is no history of hospitalizations for psychiatric concerns. Maddy stated her maternal grandmother suffered from depression and her paternal grandfather Destiny [name] "an alcoholic." Persephanie reported there is no history of trauma including psychological, physical  and sexual abuse, as well as neglect.   Latoi described her typical mood lately as "pretty happy" and "tired." Laquasha endorsed current alcohol use (a standard beverage 3-4xs a week). She denied tobacco use. She denied illicit/recreational substance use. Regarding caffeine intake, Avin reported consuming two cups of coffee daily. Furthermore, Teniyah indicated she is not experiencing the following: hallucinations and delusions, paranoia, symptoms of mania , social withdrawal, crying spells, panic attacks and decreased motivation. She also denied history of and current suicidal ideation, plan,  and intent; history of and current homicidal ideation, plan, and intent; and history of and current engagement in self-harm.  The following strengths were reported by Destiny Destiny [name]: very organized; spending time with family; and very responsible. The following strengths were observed by this provider: ability to express thoughts and feelings during the therapeutic session, ability to establish and benefit from a therapeutic relationship, willingness to work toward established goal(s) with the clinic and ability to engage in reciprocal conversation.   Legal History: Cristin reported there is no history of legal involvement.   Structured Assessments Results: The Patient Health Questionnaire-9 (PHQ-9) is a self-report measure that assesses symptoms and severity of depression over the course of the last two weeks. Brinna obtained a score of 4 suggesting minimal depression. Tynlee finds the endorsed symptoms to be not difficult at all. [0= Not at all; 1= Several days; 2= More than half the days; 3= Nearly every day] Little interest or pleasure in doing things 0  Feeling down, depressed, or hopeless 0  Trouble falling or staying asleep, or sleeping too much- due to baby 1  Feeling tired or having little energy 2  Poor appetite or overeating 1  Feeling bad about yourself --- or that you are a failure or have let yourself or your family down 0  Trouble concentrating on things, such as reading the newspaper or watching television 0  Moving or speaking so slowly that other people could have noticed? Or the opposite ---  being so fidgety or restless that you have been moving around a lot more than usual 0  Thoughts that you would be better off dead or hurting yourself in some way 0  PHQ-9 Score 4    The Generalized Anxiety Disorder-7 (GAD-7) is a brief self-report measure that assesses symptoms of anxiety over the course of the last two weeks. Cydnee obtained a score of 0. [0= Not at all; 1= Several days; 2= Over  half the days; 3= Nearly every day] Feeling nervous, anxious, on edge 0  Not being able to stop or control worrying 0  Worrying too much about different things 0  Trouble relaxing 0  Being so restless that it's hard to sit still 0  Becoming easily annoyed or irritable 0  Feeling afraid as if something awful might happen 0  GAD-7 Score 0   Interventions:  Conducted a chart review Focused on rapport building Verbally administered PHQ-9 and GAD-7 for symptom monitoring Verbally administered Food & Mood questionnaire to assess various behaviors related to emotional eating Provided emphatic reflections and validation Collaborated with patient on a treatment goal  Psychoeducation provided regarding physical versus emotional hunger  Provisional DSM-5 Diagnosis(es): 307.59 (F50.8) Other Specified Feeding or Eating Disorder, Emotional and Binge Eating Behaviors  Plan: Fayth appears able and willing to participate as evidenced by collaboration on a treatment goal, engagement in reciprocal conversation, and asking questions as needed for clarification. The next appointment will be scheduled in 2-3 weeks, which will be via MyChart Video Visit. The following treatment goal Destiny [name] established: increase coping skills. This provider will regularly review the treatment plan and medical chart to keep informed of status changes. Lucielle expressed understanding and agreement with the initial treatment plan of care. Dot will be sent a handout via e-mail to utilize between now and the next appointment to increase awareness of hunger patterns and subsequent eating. Ai provided verbal consent during today's appointment for this provider to send the handout via e-mail.

## 2019-10-05 LAB — COMPREHENSIVE METABOLIC PANEL
ALT: 12 IU/L (ref 0–32)
AST: 15 IU/L (ref 0–40)
Albumin/Globulin Ratio: 1.7 (ref 1.2–2.2)
Albumin: 4.7 g/dL (ref 3.8–4.8)
Alkaline Phosphatase: 67 IU/L (ref 48–121)
BUN/Creatinine Ratio: 20 (ref 9–23)
BUN: 14 mg/dL (ref 6–24)
Bilirubin Total: 0.5 mg/dL (ref 0.0–1.2)
CO2: 26 mmol/L (ref 20–29)
Calcium: 9.8 mg/dL (ref 8.7–10.2)
Chloride: 101 mmol/L (ref 96–106)
Creatinine, Ser: 0.71 mg/dL (ref 0.57–1.00)
GFR calc Af Amer: 117 mL/min/{1.73_m2} (ref >59)
GFR calc non Af Amer: 102 mL/min/{1.73_m2} (ref >59)
Globulin, Total: 2.8 g/dL (ref 1.5–4.5)
Glucose: 88 mg/dL (ref 65–99)
Potassium: 4.7 mmol/L (ref 3.5–5.2)
Sodium: 140 mmol/L (ref 134–144)
Total Protein: 7.5 g/dL (ref 6.0–8.5)

## 2019-10-05 LAB — CBC WITH DIFFERENTIAL/PLATELET
Basophils Absolute: 0 10*3/uL (ref 0.0–0.2)
Basos: 1 %
EOS (ABSOLUTE): 0.1 10*3/uL (ref 0.0–0.4)
Eos: 1 %
Hemoglobin: 14.6 g/dL (ref 11.1–15.9)
Immature Grans (Abs): 0 10*3/uL (ref 0.0–0.1)
Immature Granulocytes: 0 %
Lymphocytes Absolute: 2.8 10*3/uL (ref 0.7–3.1)
Lymphs: 45 %
MCH: 29.5 pg (ref 26.6–33.0)
MCHC: 34.7 g/dL (ref 31.5–35.7)
MCV: 85 fL (ref 79–97)
Monocytes Absolute: 0.5 10*3/uL (ref 0.1–0.9)
Monocytes: 7 %
Neutrophils Absolute: 3 10*3/uL (ref 1.4–7.0)
Neutrophils: 46 %
Platelets: 352 10*3/uL (ref 150–450)
RBC: 4.95 x10E6/uL (ref 3.77–5.28)
RDW: 12.2 % (ref 11.7–15.4)
WBC: 6.4 10*3/uL (ref 3.4–10.8)

## 2019-10-05 LAB — LIPID PANEL
Chol/HDL Ratio: 3.6 ratio (ref 0.0–4.4)
Cholesterol, Total: 232 mg/dL — ABNORMAL HIGH (ref 100–199)
HDL: 64 mg/dL (ref >39)
LDL Chol Calc (NIH): 149 mg/dL — ABNORMAL HIGH (ref 0–99)
Triglycerides: 106 mg/dL (ref 0–149)
VLDL Cholesterol Cal: 19 mg/dL (ref 5–40)

## 2019-10-05 LAB — INSULIN, RANDOM: INSULIN: 8.4 u[IU]/mL (ref 2.6–24.9)

## 2019-10-05 LAB — ANEMIA PANEL
Ferritin: 74 ng/mL (ref 15–150)
Folate, Hemolysate: 517 ng/mL
Folate, RBC: 1228 ng/mL (ref >498)
Hematocrit: 42.1 % (ref 34.0–46.6)
Iron Saturation: 31 % (ref 15–55)
Iron: 109 ug/dL (ref 27–159)
Retic Ct Pct: 1.6 % (ref 0.6–2.6)
Total Iron Binding Capacity: 355 ug/dL (ref 250–450)
UIBC: 246 ug/dL (ref 131–425)
Vitamin B-12: 960 pg/mL (ref 232–1245)

## 2019-10-05 LAB — VITAMIN D 25 HYDROXY (VIT D DEFICIENCY, FRACTURES): Vit D, 25-Hydroxy: 96.6 ng/mL (ref 30.0–100.0)

## 2019-10-05 LAB — HEMOGLOBIN A1C
Est. average glucose Bld gHb Est-mCnc: 114 mg/dL
Hgb A1c MFr Bld: 5.6 % (ref 4.8–5.6)

## 2019-10-05 LAB — T3: T3, Total: 211 ng/dL — ABNORMAL HIGH (ref 71–180)

## 2019-10-05 LAB — FOLATE: Folate: 16.6 ng/mL (ref >3.0)

## 2019-10-05 LAB — TSH: TSH: 0.008 u[IU]/mL — ABNORMAL LOW (ref 0.450–4.500)

## 2019-10-05 LAB — T4: T4, Total: 6.5 ug/dL (ref 4.5–12.0)

## 2019-10-08 NOTE — Progress Notes (Signed)
Chief Complaint:   OBESITY Destiny [name] (MR# [id number]) is a 49 y.o. female who presents for evaluation and treatment of obesity and related comorbidities. Current BMI is Body mass index is 38.11 kg/m. Destiny [name] has been struggling with her weight for many years and has been unsuccessful in either losing weight, maintaining weight loss, or reaching her healthy weight goal.  Destiny [name] is currently in the action stage of change and ready to dedicate time achieving and maintaining a healthier weight. Destiny [name] is interested in becoming our patient and working on intensive lifestyle modifications including (but not limited to) diet and exercise for weight loss.  Destiny [name] lives with her husband, Destiny [name], and their 2 children, who are 63 and 68 months old.  In the past, she says she did her best with "lot's of exercise".  She eats outside the home 5-6 times per week.  She craves cheese, chips, pasta, rice, sweets, fries, cookies, and ice cream.  She skips breakfast 2-3 times per week.  She says she will be going to [facility] for 4 days.  Destiny [name]'s habits were reviewed today and are as follows: Her family eats meals together, she thinks her family will eat healthier with her, her desired weight loss is 86 pounds, she started gaining weight over the last few years, her heaviest weight ever was 237 pounds, she craves cheese, chips, pasta, rice, sweets, and fries, she snacks frequently in the evenings, she skips breakfast frequently, she frequently makes poor food choices, she frequently eats larger portions than normal and she struggles with emotional eating.  Depression Screen Aveleen's Food and Mood (modified PHQ-9) score was 11.  Depression screen PHQ 2/9 10/04/2019  Decreased Interest 2  Down, Depressed, Hopeless 1  PHQ - 2 Score 3  Altered sleeping 1  Change in appetite 2  Feeling bad or failure about yourself  3  Trouble concentrating 2  Moving slowly or fidgety/restless 0  Suicidal thoughts 0  PHQ-9  Score 11  Difficult doing work/chores Not difficult at all   Subjective:   1. Other fatigue Destiny [name] admits to daytime somnolence and reports waking up still tired. Patent has a history of symptoms of daytime fatigue and morning fatigue. Destiny [name] generally gets 5 hours of sleep per night, and states that she has poor sleep quality due to having a small child. Snoring is not present. Apneic episodes are not present. Epworth Sleepiness Score is 9.  2. SOB (shortness of breath) on exertion Destiny [name] notes increasing shortness of breath with exercising and seems to be worsening over time with weight gain. She notes getting out of breath sooner with activity than she used to. This has gotten worse recently. Destiny [name] denies shortness of breath at rest or orthopnea.  3. Other specified hypothyroidism Destiny [name] is taking Armour Thyroid 120 mg daily per [facility].  Lab Results  Component Value Date   TSH 0.008 (L) 10/04/2019   4. Vitamin D deficiency She is currently taking OTC vitamin D 7500 IU each day. She denies nausea, vomiting or muscle weakness.  5. Other iron deficiency anemia Destiny [name] is not a vegetarian.  She does not have a history of weight loss surgery.  She is taking OTC iron 325 mg daily.  CBC Latest Ref Rng & Units 10/04/2019 11/02/2017 11/01/2017  WBC 3.4 - 10.8 x10E3/uL 6.4 12.7(H) 13.2(H)  Hemoglobin 11.1 - 15.9 g/dL 83.1 5.1(V) 11.9(L)  Hematocrit 34.0 - 46.6 % 42.1 28.1(L) 34.6(L)  Platelets 150 - 450 x10E3/uL 352 193 203  Lab Results  Component Value Date   IRON 109 10/04/2019   TIBC 355 10/04/2019   FERRITIN 74 10/04/2019   Lab Results  Component Value Date   VITAMINB12 960 10/04/2019   6. Other depression with emotional eating Positive stress as well.  Destiny [name] is struggling with emotional eating and using food for comfort to the extent that it is negatively impacting her health. She has been working on behavior modification techniques to help reduce her  emotional eating and has been unsuccessful. She shows no sign of suicidal or homicidal ideations.  7. At risk for dehydration Destiny [name] is at risk for dehydration due to inadequate water intake.  Assessment/Plan:   1. Other fatigue Destiny [name] does feel that her weight is causing her energy to be lower than it should be. Fatigue may be related to obesity, depression or many other causes. Labs will be ordered, and in the meanwhile, Destiny [name] will focus on self care including making healthy food choices, increasing physical activity and focusing on stress reduction. - EKG 12-Lead - Vitamin B12 - CBC with Differential/Platelet - Comprehensive metabolic panel - Folate - Hemoglobin A1c - Insulin, random - T3 - T4 - TSH  2. SOB (shortness of breath) on exertion Destiny [name] does feel that she gets out of breath more easily that she used to when she exercises. Destiny [name]'s shortness of breath appears to be obesity related and exercise induced. She has agreed to work on weight loss and gradually increase exercise to treat her exercise induced shortness of breath. Will continue to monitor closely. - Lipid panel  3. Other specified hypothyroidism Patient with [place]-standing hypothyroidism, on levothyroxine therapy. She appears euthyroid. Orders and follow up as documented in patient record.  Counseling . Good thyroid control is important for overall health. Destiny [name] are dangerous and will not improve weight loss results. . The correct way to take levothyroxine is fasting, with water, separated by at least 30 minutes from breakfast, and separated by more than 4 hours from calcium, iron, multivitamins, acid reflux medications (PPIs).  - T3 - T4 - TSH  4. Vitamin D deficiency Low Vitamin D level contributes to fatigue and are associated with obesity, breast, and colon cancer. - VITAMIN D 25 Hydroxy (Vit-D Deficiency, Fractures)  5. Other iron deficiency anemia Orders and follow up as  documented in patient record.  Counseling . Iron is essential for our bodies to make red blood cells.  Reasons that someone may be deficient include: an iron-deficient diet (more likely in those following vegan or vegetarian diets), women with heavy menses, patients with GI disorders or poor absorption, patients that have had bariatric surgery, frequent blood donors, patients with cancer, and patients with heart disease.   [name] foods include dark leafy greens, red and white meats, eggs, seafood, and beans.   . Certain foods and drinks prevent your body from absorbing iron properly. Avoid eating these foods in the same meal as iron-rich foods or with iron supplements. These foods include: coffee, black tea, and red wine; milk, dairy products, and foods that are high in calcium; beans and soybeans; whole grains.  . Constipation can be a side effect of iron supplementation. Increased water and fiber intake are helpful. Water goal: > 2 liters/day. Fiber goal: > 25 grams/day. - CBC with Differential/Platelet  6. Other depression with emotional eating Behavior modification techniques were discussed today to help Destiny [name] deal with her emotional/non-hunger eating behaviors.  Orders and follow up as documented in patient record.  7. At risk for dehydration Jettie was given approximately 15 minutes dehydration prevention counseling today. Amayrany is at risk for dehydration due to weight loss and current medication(s). She was encouraged to hydrate and monitor fluid status to avoid dehydration as well as weight loss plateaus.   8. Class 2 severe obesity with serious comorbidity and body mass index (BMI) of 38.0 to 38.9 in adult, unspecified obesity type (HCC) Josilyn is currently in the action stage of change and her goal is to continue with weight loss efforts. I recommend Abbigale begin the structured treatment plan as follows:  She has agreed to the Category 2 Plan.  Exercise goals: As is.    Behavioral modification strategies: increasing lean protein intake, increasing water intake, meal planning and cooking strategies and planning for success.  She was informed of the importance of frequent follow-up visits to maximize her success with intensive lifestyle modifications for her multiple health conditions. She was informed we would discuss her lab results at her next visit unless there is a critical issue that needs to be addressed sooner. Kabao agreed to keep her next visit at the agreed upon time to discuss these results.  Objective:   Blood pressure 103/66, pulse 78, temperature 98 F (36.7 C), height 5\' 5"  (1.651 m), weight 229 lb (103.9 kg), last menstrual period 08/30/2019, SpO2 98 %, not currently breastfeeding. Body mass index is 38.11 kg/m.  EKG: Normal sinus rhythm, rate 72 bpm.  Indirect Calorimeter completed today shows a VO2 of 276 and a REE of 1920.  Her calculated basal metabolic rate is 10/31/2019 thus her basal metabolic rate is better than expected.  General: Cooperative, alert, well developed, in no acute distress. HEENT: Conjunctivae and lids unremarkable. Cardiovascular: Regular rhythm.  Lungs: Normal work of breathing. Neurologic: No focal deficits.   Lab Results  Component Value Date   CREATININE 0.71 10/04/2019   BUN 14 10/04/2019   NA 140 10/04/2019   K 4.7 10/04/2019   CL 101 10/04/2019   CO2 26 10/04/2019   Lab Results  Component Value Date   ALT 12 10/04/2019   AST 15 10/04/2019   ALKPHOS 67 10/04/2019   BILITOT 0.5 10/04/2019   Lab Results  Component Value Date   HGBA1C 5.6 10/04/2019   Lab Results  Component Value Date   INSULIN 8.4 10/04/2019   Lab Results  Component Value Date   TSH 0.008 (L) 10/04/2019   Lab Results  Component Value Date   CHOL 232 (H) 10/04/2019   HDL 64 10/04/2019   LDLCALC 149 (H) 10/04/2019   TRIG 106 10/04/2019   CHOLHDL 3.6 10/04/2019   Lab Results  Component Value Date   WBC 6.4 10/04/2019    HGB 14.6 10/04/2019   HCT 42.1 10/04/2019   MCV 85 10/04/2019   PLT 352 10/04/2019   Lab Results  Component Value Date   IRON 109 10/04/2019   TIBC 355 10/04/2019   FERRITIN 74 10/04/2019   Attestation Statements:   Reviewed by clinician on day of visit: allergies, medications, problem list, medical history, surgical history, family history, social history, and previous encounter notes.  I, 12/04/2019, CMA, am acting as Insurance claims handler for Energy manager, DO.  I have reviewed the above documentation for accuracy and completeness, and I agree with the above. [employer], DO

## 2019-10-11 ENCOUNTER — Telehealth (INDEPENDENT_AMBULATORY_CARE_PROVIDER_SITE_OTHER): Payer: Federal, State, Local not specified - PPO | Admitting: Psychology

## 2019-10-11 ENCOUNTER — Other Ambulatory Visit: Payer: Self-pay

## 2019-10-11 DIAGNOSIS — F5089 Other specified eating disorder: Secondary | ICD-10-CM

## 2019-10-15 NOTE — Progress Notes (Unsigned)
Office: [phone]  /  Fax: [phone]    Date: October 29, 2019   Appointment Start Time: *** Duration: *** minutes Provider: Lawerance Cruel, Psy.D. Type of Session: Individual Therapy  [name] of Patient: {gbptloc:23249} [name] of Provider: {[name] of Service:22491} Type of Contact: Telepsychological Visit via MyChart Video Visit  Session Content: Destiny [name] is a 49 y.o. female presenting for a follow-up appointment to address the previously established treatment goal of increasing coping skills. Today's appointment was a telepsychological visit due to COVID-19. Destiny [name] provided verbal consent for today's telepsychological appointment and she is aware she is responsible for securing confidentiality on her end of the session. Prior to proceeding with today's appointment, Destiny [name] at the time of this appointment was obtained as well a phone number she could be reached at in the event of technical difficulties. Destiny [name] and this provider participated in today's telepsychological service.   This provider conducted a brief check-in and verbally administered the PHQ-9 and GAD-7. *** Destiny [name] was receptive to today's appointment as evidenced by openness to sharing, responsiveness to feedback, and {gbreceptiveness:23401}.  Mental Status Examination:  Appearance: {Appearance:22431} Behavior: {Behavior:22445} Mood: {gbmood:21757} Affect: {Affect:22436} Speech: {Speech:22432} Eye Contact: {Eye Contact:22433} Psychomotor Activity: {Motor Activity:22434} Gait: {gbgait:23404} Thought Process: {thought process:22448}  Thought Content/Perception: {disturbances:22451} Orientation: {Orientation:22437} Memory/Concentration: {gbcognition:22449} Insight/Judgment: {Insight:22446}  Structured Assessments Results: The Patient Health Questionnaire-9 (PHQ-9) is a self-report measure that assesses symptoms and severity of depression over the course of the last two weeks. Destiny [name] obtained a  score of *** suggesting {GBPHQ9SEVERITY:21752}. Destiny [name] finds the endorsed symptoms to be {gbphq9difficulty:21754}. [0= Not at all; 1= Several days; 2= More than half the days; 3= Nearly every day] Little interest or pleasure in doing things ***  Feeling down, depressed, or hopeless ***  Trouble falling or staying asleep, or sleeping too much ***  Feeling tired or having little energy ***  Poor appetite or overeating ***  Feeling bad about yourself --- or that you are a failure or have let yourself or your family down ***  Trouble concentrating on things, such as reading the newspaper or watching television ***  Moving or speaking so slowly that other people could have noticed? Or the opposite --- being so fidgety or restless that you have been moving around a lot more than usual ***  Thoughts that you would be better off dead or hurting yourself in some way ***  PHQ-9 Score ***    The Generalized Anxiety Disorder-7 (GAD-7) is a brief self-report measure that assesses symptoms of anxiety over the course of the last two weeks. Destiny [name] obtained a score of *** suggesting {gbgad7severity:21753}. Destiny [name] finds the endorsed symptoms to be {gbphq9difficulty:21754}. [0= Not at all; 1= Several days; 2= Over half the days; 3= Nearly every day] Feeling nervous, anxious, on edge ***  Not being able to stop or control worrying ***  Worrying too much about different things ***  Trouble relaxing ***  Being so restless that it's hard to sit still ***  Becoming easily annoyed or irritable ***  Feeling afraid as if something awful might happen ***  GAD-7 Score ***   Interventions:  {Interventions for Progress Notes:23405}  DSM-5 Diagnosis(es): 307.59 (F50.8) Other Specified Feeding or Eating Disorder, Emotional Eating Behaviors  Treatment Goal & Progress: During the initial appointment with this provider, the following treatment goal was established: increase coping skills. Destiny [name] has demonstrated progress  in her goal as evidenced by {gbtxprogress:22839}. Destiny [name] also {gbtxprogress2:22951}.  Plan: The next appointment will be scheduled in {gbweeks:21758},  which will be {gbtxmodality:23402}. The next session will focus on {Plan for Next Appointment:23400}.

## 2019-10-18 ENCOUNTER — Ambulatory Visit (INDEPENDENT_AMBULATORY_CARE_PROVIDER_SITE_OTHER): Payer: Federal, State, Local not specified - PPO | Admitting: Family Medicine

## 2019-10-18 ENCOUNTER — Other Ambulatory Visit: Payer: Self-pay

## 2019-10-18 VITALS — BP 122/71 | HR 78 | Temp 97.8°F | Ht 65.0 in | Wt 222.0 lb

## 2019-10-18 DIAGNOSIS — E038 Other specified hypothyroidism: Secondary | ICD-10-CM | POA: Diagnosis not present

## 2019-10-18 DIAGNOSIS — E8881 Metabolic syndrome: Secondary | ICD-10-CM

## 2019-10-18 DIAGNOSIS — E559 Vitamin D deficiency, unspecified: Secondary | ICD-10-CM

## 2019-10-18 DIAGNOSIS — E7849 Other hyperlipidemia: Secondary | ICD-10-CM | POA: Diagnosis not present

## 2019-10-18 DIAGNOSIS — Z9189 Other specified personal risk factors, not elsewhere classified: Secondary | ICD-10-CM | POA: Diagnosis not present

## 2019-10-18 DIAGNOSIS — D508 Other iron deficiency anemias: Secondary | ICD-10-CM | POA: Diagnosis not present

## 2019-10-18 DIAGNOSIS — Z6836 Body mass index (BMI) 36.0-36.9, adult: Secondary | ICD-10-CM

## 2019-10-18 MED ORDER — THYROID 120 MG PO TABS: ORAL_TABLET | ORAL | Status: DC

## 2019-10-18 MED ORDER — THYROID 90 MG PO TABS: ORAL_TABLET | ORAL | 0 refills | Status: DC

## 2019-10-18 MED ORDER — VITAMIN D (ERGOCALCIFEROL) 1.25 MG (50000 UNIT) PO CAPS: ORAL_CAPSULE | ORAL | 0 refills | Status: DC

## 2019-10-18 NOTE — Progress Notes (Signed)
Chief Complaint:   OBESITY Destiny [name] is here to discuss her progress with her obesity treatment plan along with follow-up of her obesity related diagnoses. Destiny [name] is on the Category 2 Plan and states she is following her eating plan approximately 80% of the time. Destiny [name] states she is doing pilates for 50 minutes 1 times per week.  Today's visit was #: 2 Starting weight: 229 lbs Starting date: 10/04/2019 Today's weight: 222 lbs Today's date: 10/29/2019 Total lbs lost to date: 7 lbs Total lbs lost since last in-office visit: 7 lbs  Interim History: Destiny [name] says she went on vacation for 4 days and ate off plan at that time half of the time.  Her hunger and cravings are well-controlled.  She says she has been weighing her proteins.  Denies constipation.  She is not sure how much water she is drinking.  Subjective:   1. Vitamin D deficiency [name]'s Vitamin D level was 96.6 on 10/04/2019. She is currently taking OTC vitamin D 7500 IU each day. She denies nausea, vomiting or muscle weakness.    2. Other iron deficiency anemia Destiny [name] is not a vegetarian.  She does not have a history of weight loss surgery.  She is taking an iron supplement.  CBC Latest Ref Rng & Units 10/04/2019 11/02/2017 11/01/2017  WBC 3.4 - 10.8 x10E3/uL 6.4 12.7(H) 13.2(H)  Hemoglobin 11.1 - 15.9 g/dL 26.7 1.2(W) 11.9(L)  Hematocrit 34.0 - 46.6 % 42.1 28.1(L) 34.6(L)  Platelets 150 - 450 x10E3/uL 352 193 203   Lab Results  Component Value Date   IRON 109 10/04/2019   TIBC 355 10/04/2019   FERRITIN 74 10/04/2019   Lab Results  Component Value Date   VITAMINB12 960 10/04/2019   3. Other specified hypothyroidism Destiny [name] takes Armour Thyroid 120 mg alternating with 90 mg.  No symptoms or concerns.  Low TSH and elevated T3.  Lab Results  Component Value Date   TSH 0.008 (L) 10/04/2019   4. Other hyperlipidemia Destiny [name] has hyperlipidemia and has been trying to improve her cholesterol levels with intensive lifestyle  modification including a low saturated fat diet, exercise and weight loss. She denies any chest pain, claudication or myalgias.  She says it has always been perfect and never up until now.  Lab Results  Component Value Date   ALT 12 10/04/2019   AST 15 10/04/2019   ALKPHOS 67 10/04/2019   BILITOT 0.5 10/04/2019   Lab Results  Component Value Date   CHOL 232 (H) 10/04/2019   HDL 64 10/04/2019   LDLCALC 149 (H) 10/04/2019   TRIG 106 10/04/2019   CHOLHDL 3.6 10/04/2019   5. Insulin resistance [name] has a diagnosis of insulin resistance based on her elevated fasting insulin level >5. She continues to work on diet and exercise to decrease her risk of diabetes.  Lab Results  Component Value Date   INSULIN 8.4 10/04/2019   Lab Results  Component Value Date   HGBA1C 5.6 10/04/2019   6. At risk for diabetes mellitus Destiny [name] is at higher than average risk for developing diabetes due to her obesity and new onset insulin resistance.   Assessment/Plan:   1. Vitamin D deficiency Discussed labs with patient today.  Low Vitamin D level contributes to fatigue and are associated with obesity, breast, and colon cancer. She agrees to start to take prescription Vitamin D @50 ,000 IU every 10 days and will follow-up for routine testing of Vitamin D, at least 2-3 times per year to avoid  over-replacement.  Will start prescription vitamin D, as per below, and stop OTC vitamin D.  - Start Vitamin D 50,000 one capsule by mouth every 10 days, #3, 0 refills.  2. Other iron deficiency anemia Discussed labs with patient today.  Orders and follow up as documented in patient record.  Continue iron 325 mg daily.  Levels are at goal.  Counseling . Iron is essential for our bodies to make red blood cells.  Reasons that someone may be deficient include: an iron-deficient diet (more likely in those following vegan or vegetarian diets), women with heavy menses, patients with GI disorders or poor absorption,  patients that have had bariatric surgery, frequent blood donors, patients with cancer, and patients with heart disease.   [name] foods include dark leafy greens, red and white meats, eggs, seafood, and beans.   . Certain foods and drinks prevent your body from absorbing iron properly. Avoid eating these foods in the same meal as iron-rich foods or with iron supplements. These foods include: coffee, black tea, and red wine; milk, dairy products, and foods that are high in calcium; beans and soybeans; whole grains.  . Constipation can be a side effect of iron supplementation. Increased water and fiber intake are helpful. Water goal: > 2 liters/day. Fiber goal: > 25 grams/day.  3. Other specified hypothyroidism Worsening.  Discussed labs with patient today.  Patient with [place]-standing hypothyroidism, on levothyroxine therapy. She appears euthyroid. Orders and follow up as documented in patient record.  No change in dose today.  Will recheck levels in 6 weeks.  - Start Armour Thyroid 1 tablet by mouth alternating days with 120 mg tablets, #30, 0 refills.  Counseling . Good thyroid control is important for overall health. Supratherapeutic thyroid levels are dangerous and will not improve weight loss results. . The correct way to take levothyroxine is fasting, with water, separated by at least 30 minutes from breakfast, and separated by more than 4 hours from calcium, iron, multivitamins, acid reflux medications (PPIs).   4. Other hyperlipidemia New.  Discussed labs with patient today.  Cardiovascular risk and specific lipid/LDL goals reviewed.  We discussed several lifestyle modifications today and Analyce will continue to work on diet, exercise and weight loss efforts. Orders and follow up as documented in patient record.  Decrease saturated/trans fats.  Recheck labs in 3 months.  Continue prudent nutritional plan, weight loss.  Counseling Intensive lifestyle modifications are the first line  treatment for this issue. . Dietary changes: Increase soluble fiber. Decrease simple carbohydrates. . Exercise changes: Moderate to vigorous-intensity aerobic activity 150 minutes per week if tolerated. . Lipid-lowering medications: see documented in medical record.  5. Insulin resistance New.  Discussed labs with patient today.  Eleina will continue to work on weight loss, exercise, and decreasing simple carbohydrates to help decrease the risk of diabetes. Marjorie agreed to follow-up with [place] as directed to closely monitor her progress.  Recheck in 3 months.  Continue prudent nutritional plan, weight loss.  6. At risk for diabetes mellitus Lavilla was given approximately 30 minutes of diabetes education and counseling today. We discussed intensive lifestyle modifications today with an emphasis on weight loss as well as increasing exercise and decreasing simple carbohydrates in her diet. We also reviewed medication options with an emphasis on risk versus benefit of those discussed.   Repetitive spaced learning was employed today to elicit superior memory formation and behavioral change.  7. Class 2 severe obesity with serious comorbidity and body mass index (BMI)  of 36.0 to 36.9 in adult, unspecified obesity type [facility]) Lenea is currently in the action stage of change. As such, her goal is to continue with weight loss efforts. She has agreed to the Category 2 Plan.   Exercise goals: As is.  Behavioral modification strategies: increasing lean protein intake, increasing water intake, decreasing eating out, meal planning and cooking strategies and planning for success.  Grayson has agreed to follow-up with our clinic in 2 weeks. She was informed of the importance of frequent follow-up visits to maximize her success with intensive lifestyle modifications for her multiple health conditions.   Objective:   Blood pressure 122/71, pulse 78, temperature 97.8 F (36.6 C), height 5\' 5"  (1.651 m), weight  222 lb (100.7 kg), SpO2 99 %. Body mass index is 36.94 kg/m.  General: Cooperative, alert, well developed, in no acute distress. HEENT: Conjunctivae and lids unremarkable. Cardiovascular: Regular rhythm.  Lungs: Normal work of breathing. Neurologic: No focal deficits.   Lab Results  Component Value Date   CREATININE 0.71 10/04/2019   BUN 14 10/04/2019   NA 140 10/04/2019   K 4.7 10/04/2019   CL 101 10/04/2019   CO2 26 10/04/2019   Lab Results  Component Value Date   ALT 12 10/04/2019   AST 15 10/04/2019   ALKPHOS 67 10/04/2019   BILITOT 0.5 10/04/2019   Lab Results  Component Value Date   HGBA1C 5.6 10/04/2019   Lab Results  Component Value Date   INSULIN 8.4 10/04/2019   Lab Results  Component Value Date   TSH 0.008 (L) 10/04/2019   Lab Results  Component Value Date   CHOL 232 (H) 10/04/2019   HDL 64 10/04/2019   LDLCALC 149 (H) 10/04/2019   TRIG 106 10/04/2019   CHOLHDL 3.6 10/04/2019   Lab Results  Component Value Date   WBC 6.4 10/04/2019   HGB 14.6 10/04/2019   HCT 42.1 10/04/2019   MCV 85 10/04/2019   PLT 352 10/04/2019   Lab Results  Component Value Date   IRON 109 10/04/2019   TIBC 355 10/04/2019   FERRITIN 74 10/04/2019   Attestation Statements:   Reviewed by clinician on day of visit: allergies, medications, problem list, medical history, surgical history, family history, social history, and previous encounter notes.  I, 12/04/2019, CMA, am acting as Insurance claims handler for Energy manager, DO.  I have reviewed the above documentation for accuracy and completeness, and I agree with the above. [employer], DO

## 2019-10-18 NOTE — Patient Instructions (Signed)
The 10-year ASCVD risk score Denman George DC Montez Hageman., et al., 2013) is: 0.9%   Values used to calculate the score:     Age: 49 years     Sex: Female     Is Non-Hispanic African American: No     Diabetic: No     Tobacco smoker: No     Systolic Blood Pressure: 122 mmHg     Is BP treated: No     HDL Cholesterol: 64 mg/dL     Total Cholesterol: 232 mg/dL

## 2019-10-29 ENCOUNTER — Telehealth (INDEPENDENT_AMBULATORY_CARE_PROVIDER_SITE_OTHER): Payer: Self-pay | Admitting: Psychology

## 2019-10-31 NOTE — Progress Notes (Signed)
  Office: [phone]  /  Fax: [phone]    Date: November 14, 2019   Appointment Start Time: 9:59am Duration: 29 minutes Provider: Lawerance Cruel, Psy.D. Type of Session: Individual Therapy  Location of Patient: Home Location of Provider: Provider's Home Type of Contact: Telepsychological Visit via MyChart Video Visit  Session Content: Donielle is a 49 y.o. female presenting for a follow-up appointment to address the previously established treatment goal of increasing coping skills. Today's appointment was a telepsychological visit due to COVID-19. [name] provided verbal consent for today's telepsychological appointment and she is aware she is responsible for securing confidentiality on her end of the session. Prior to proceeding with today's appointment, Shaunae's physical location at the time of this appointment was obtained as well a phone number she could be reached at in the event of technical difficulties. Berlynn and this provider participated in today's telepsychological service. Of note, Jozalynn stated her daughter (age 29) was in the living room, noting she was busy and not listening. Today's appointment was switched to a regular telephone call at 10:04am with Andie's verbal consent due to technical issues.   This provider conducted a brief check-in. Crystalle reported her mother in law passed away, noting the funeral is coming up. Regarding eating, Huntley stated she lost weight initially and feels like she has hit a plateau. Associated thoughts and feelings were processed. Reviewed emotional and physical hunger. Tallyn reported a reduction in cravings and snacking. Positive reinforcement was provided. Additionally, psychoeducation regarding triggers for emotional eating was provided. Nicky was provided a handout, and encouraged to utilize the handout between now and the next appointment to increase awareness of triggers and frequency. [name] agreed. This provider also discussed behavioral  strategies for specific triggers, such as placing the utensil down when conversing to avoid mindless eating. Avaiah provided verbal consent during today's appointment for this provider to send a handout about triggers via e-mail. Nanna was receptive to today's appointment as evidenced by openness to sharing, responsiveness to feedback, and willingness to explore triggers for emotional eating.  Mental Status Examination:  Appearance: well groomed and appropriate hygiene  Behavior: appropriate to circumstances Mood: euthymic Affect: mood congruent Speech: normal in rate, volume, and tone Eye Contact: appropriate Psychomotor Activity: appropriate Gait: unable to assess Thought Process: linear, logical, and goal directed  Thought Content/Perception: no hallucinations, delusions, bizarre thinking or behavior reported or observed and no evidence of suicidal and homicidal ideation, plan, and intent Orientation: time, person, place, and purpose of appointment Memory/Concentration: memory, attention, language, and fund of knowledge intact  Insight/Judgment: good  Interventions:  Conducted a brief chart review Provided empathic reflections and validation Reviewed content from the previous session Employed supportive psychotherapy interventions to facilitate reduced distress and to improve coping skills with identified stressors Psychoeducation provided regarding triggers for emotional eating  Provided positive reinforcement   DSM-5 Diagnosis(es): 307.59 (F50.8) Other Specified Feeding or Eating Disorder, Emotional Eating and Binge Eating Behaviors  Treatment Goal & Progress: During the initial appointment with this provider, the following treatment goal was established: increase coping skills. Terie has demonstrated progress in her goal as evidenced by increased awareness of hunger patterns.   Plan: The next appointment will be scheduled in two weeks, which will be via MyChart Video Visit. The  next session will focus on working towards the established treatment goal.

## 2019-11-07 ENCOUNTER — Encounter (INDEPENDENT_AMBULATORY_CARE_PROVIDER_SITE_OTHER): Payer: Self-pay | Admitting: Family Medicine

## 2019-11-07 ENCOUNTER — Other Ambulatory Visit: Payer: Self-pay

## 2019-11-07 ENCOUNTER — Ambulatory Visit (INDEPENDENT_AMBULATORY_CARE_PROVIDER_SITE_OTHER): Payer: Federal, State, Local not specified - PPO | Admitting: Family Medicine

## 2019-11-07 VITALS — BP 119/74 | HR 83 | Temp 98.1°F | Ht 65.0 in | Wt 219.0 lb

## 2019-11-07 DIAGNOSIS — E559 Vitamin D deficiency, unspecified: Secondary | ICD-10-CM

## 2019-11-07 DIAGNOSIS — Z6836 Body mass index (BMI) 36.0-36.9, adult: Secondary | ICD-10-CM | POA: Diagnosis not present

## 2019-11-07 DIAGNOSIS — Z9189 Other specified personal risk factors, not elsewhere classified: Secondary | ICD-10-CM

## 2019-11-07 DIAGNOSIS — E039 Hypothyroidism, unspecified: Secondary | ICD-10-CM

## 2019-11-08 NOTE — Progress Notes (Signed)
Chief Complaint:   OBESITY Destiny [name] is here to discuss her progress with her obesity treatment plan along with follow-up of her obesity related diagnoses. Destiny [name] is on the Category 2 Plan and states she is following her eating plan approximately 80% of the time. Destiny [name] states she is walking 2 miles in 30 minutes 5 times per week.  Today's visit was #: 3 Starting weight: 229 lbs Starting date: 10/04/2019 Today's weight: 219 lbs Today's date: 11/07/2019 Total lbs lost to date: 10 lbs Total lbs lost since last in-office visit: 3 lbs  Interim History: Destiny [name]'s mother-in-law passed away 2-3 days ago.  They had "travel eating" and "stress eating".  She lived in [place], so they had to travel.  Her 48-year-old daughter had a birthday as well, and she ate cupcakes.  She has been frustrated as she was not seeing the weight on the scale go down.    Subjective:   1. Hypothyroidism, unspecified type She was over medicated on Armour Thyroid by [facility] or PCP.  At last office visit we decreased the dose due to too low TSH.  Denies symptoms or concerns with new dose.   Lab Results  Component Value Date   TSH 0.008 (L) 10/04/2019   2. Vitamin D deficiency Destiny [name]'s Vitamin D level was 96.6 on 10/04/2019. She is currently taking prescription vitamin D 7,500 IU every 10 days. She denies nausea, vomiting or muscle weakness.  She is tolerating it well.  She changed to 7500 IU every 10 days instead of daily.   3. At risk for osteoporosis Destiny [name] was given approximately 48 minutes of osteoporosis prevention counseling today.   Destiny [name] is at risk for osteopenia and osteoporosis due to Vitamin D deficiency, as well as other risk factors.  We discussed the importance of prudent screenings through she PCP's office for prevention.  she was encouraged to take she Vitamin D and follow she calcium rich diet.  It is recommended that she eventually engage in weight bearing exercises and muscle strengthening exercises to  help improve bone density and decrease she risk of osteopenia and osteoporosis.  Assessment/Plan:   1. Hypothyroidism, unspecified type At next office visit, check TSH, T3, T4 since change in dose 6+ weeks prior.  Continue to alter doses daily as she is doing.  2. Vitamin D deficiency Low Vitamin D level contributes to fatigue and are associated with obesity, breast, and colon cancer. She agrees to continue to take OTC Vitamin D @50 ,000 IU every 10 days and will follow-up for routine testing of Vitamin D, at least 2-3 times per year to avoid over-replacement.  Recheck labs in 2-3 months after change, around 01/04/2020.    3. At risk for osteoporosis Destiny [name] was given approximately 48 minutes of osteoporosis prevention counseling today.   Destiny [name] is at risk for osteopenia and osteoporosis due to Vitamin D deficiency, as well as other risk factors.  We discussed the importance of prudent screenings through she PCP's office for prevention.  she was encouraged to take she Vitamin D and follow she calcium rich diet.  It is recommended that she eventually engage in weight bearing exercises and muscle strengthening exercises to help improve bone density and decrease she risk of osteopenia and osteoporosis.  4. Class 2 severe obesity with serious comorbidity and body mass index (BMI) of 36.0 to 36.9 in adult, unspecified obesity type (HCC) Destiny [name] is currently in the action stage of change. As such, her goal is to continue with weight loss efforts.  She has agreed to the Category 2 Plan.   Exercise goals: As is.  Behavioral modification strategies: increasing lean protein intake, meal planning and cooking strategies, travel eating strategies and planning for success.  Do no weigh yourself at home.  Destiny [name] has agreed to follow-up with our clinic in 2 weeks. She was informed of the importance of frequent follow-up visits to maximize her success with intensive lifestyle modifications for her multiple health  conditions.   Objective:   Blood pressure 119/74, pulse 83, temperature 98.1 F (36.7 C), height 5\' 5"  (1.651 m), weight 219 lb (99.3 kg), SpO2 97 %. Body mass index is 36.44 kg/m.  General: Cooperative, alert, well developed, in no acute distress. HEENT: Conjunctivae and lids unremarkable. Cardiovascular: Regular rhythm.  Lungs: Normal work of breathing. Neurologic: No focal deficits.   Lab Results  Component Value Date   CREATININE 0.71 10/04/2019   BUN 14 10/04/2019   NA 140 10/04/2019   K 4.7 10/04/2019   CL 101 10/04/2019   CO2 26 10/04/2019   Lab Results  Component Value Date   ALT 12 10/04/2019   AST 15 10/04/2019   ALKPHOS 67 10/04/2019   BILITOT 0.5 10/04/2019   Lab Results  Component Value Date   HGBA1C 5.6 10/04/2019   Lab Results  Component Value Date   INSULIN 8.4 10/04/2019   Lab Results  Component Value Date   TSH 0.008 (L) 10/04/2019   Lab Results  Component Value Date   CHOL 232 (H) 10/04/2019   HDL 64 10/04/2019   LDLCALC 149 (H) 10/04/2019   TRIG 106 10/04/2019   CHOLHDL 3.6 10/04/2019   Lab Results  Component Value Date   WBC 6.4 10/04/2019   HGB 14.6 10/04/2019   HCT 42.1 10/04/2019   MCV 85 10/04/2019   PLT 352 10/04/2019   Lab Results  Component Value Date   IRON 109 10/04/2019   TIBC 355 10/04/2019   FERRITIN 74 10/04/2019   Attestation Statements:   Reviewed by clinician on day of visit: allergies, medications, problem list, medical history, surgical history, family history, social history, and previous encounter notes.  I, 12/04/2019, CMA, am acting as Insurance claims handler for Energy manager, DO.  I have reviewed the above documentation for accuracy and completeness, and I agree with the above. [employer], DO

## 2019-11-14 ENCOUNTER — Other Ambulatory Visit: Payer: Self-pay

## 2019-11-14 ENCOUNTER — Telehealth (INDEPENDENT_AMBULATORY_CARE_PROVIDER_SITE_OTHER): Payer: Federal, State, Local not specified - PPO | Admitting: Psychology

## 2019-11-14 DIAGNOSIS — F5089 Other specified eating disorder: Secondary | ICD-10-CM | POA: Diagnosis not present

## 2019-11-14 NOTE — Progress Notes (Signed)
  Office: [phone]  /  Fax: [phone]    Date: November 28, 2019   Appointment Start Time: 9:59am Duration: 31 minutes Provider: Lawerance Cruel, Psy.D. Type of Session: Individual Therapy  Location of Patient: Home Location of Provider: Provider's Home Type of Contact: Telepsychological Visit via MyChart Video Visit  Session Content: Destiny [name] is a 49 y.o. female presenting for a follow-up appointment to address the previously established treatment goal of increasing coping skills. Today's appointment was a telepsychological visit due to COVID-19. Destiny [name] provided verbal consent for today's telepsychological appointment and she is aware she is responsible for securing confidentiality on her end of the session. Prior to proceeding with today's appointment, Destiny [name]'s physical location at the time of this appointment was obtained as well a phone number she could be reached at in the event of technical difficulties. Destiny [name] and this provider participated in today's telepsychological service.   This provider conducted a brief check-in. Destiny [name] reported her whole family is sick. Regarding eating, she indicated it is "going well." She acknowledged deviating on one occasion, but noted engaging in portion control. Positive reinforcement was provided. Psychoeducation regarding making better choices and engaging in portion control during the holidays/celebrations was provided per Destiny [name]'s request. More specifically, this provider discussed the following strategies: coming to meals hungry, but not starving; avoid filling up on appetizers; managing portion sizes; not completely depriving yourself; making the plate colorful (e.g., vegetables); pacing yourself (e.g., waiting 10 minutes before going back for seconds, drinking water); taking advantage of the nutritious foods; practicing mindfulness; staying hydrated; and avoid/limit bringing home leftovers. Notably, this provider discussed her upcoming maternity leave  toward the end of November. Destiny [name] acknowledged understanding given the uncertain nature of the circumstances, this provider may be out of the office sooner. This provider and Destiny [name] discussed referral options and verbal consent was provided for this provider to send a list of referral options via e-mail. All questions/concerns were addressed. Lisett denied any concerns. Overall, Destiny [name] was receptive to today's appointment as evidenced by openness to sharing, responsiveness to feedback, and willingness to implement discussed strategies during her upcoming birthday celebration.   Mental Status Examination:  Appearance: well groomed and appropriate hygiene  Behavior: appropriate to circumstances Mood: euthymic Affect: mood congruent Speech: normal in rate, volume, and tone Eye Contact: appropriate Psychomotor Activity: appropriate Gait: unable to assess Thought Process: linear, logical, and goal directed  Thought Content/Perception: no hallucinations, delusions, bizarre thinking or behavior reported or observed and no evidence of suicidal and homicidal ideation, plan, and intent Orientation: time, person, place, and purpose of appointment Memory/Concentration: memory, attention, language, and fund of knowledge intact  Insight/Judgment: good  Interventions:  Conducted a brief chart review Provided empathic reflections and validation Provided positive reinforcement Employed supportive psychotherapy interventions to facilitate reduced distress and to improve coping skills with identified stressors  Discussed behavioral strategies for celebrations/holidays   DSM-5 Diagnosis(es): 307.59 (F50.8) Other Specified Feeding or Eating Disorder, Emotional Eating Behaviors  Treatment Goal & Progress: During the initial appointment with this provider, the following treatment goal was established: increase coping skills. Destiny [name] has demonstrated progress in her goal as evidenced by increased awareness of  hunger patterns and increased awareness of triggers for emotional eating. Destiny [name] also continues to demonstrate willingness to engage in learned skill(s).  Plan: The next appointment will be scheduled in two weeks, which will be via MyChart Video Visit. The next session will focus on working towards the established treatment goal.

## 2019-11-19 ENCOUNTER — Encounter (INDEPENDENT_AMBULATORY_CARE_PROVIDER_SITE_OTHER): Payer: Self-pay | Admitting: Family Medicine

## 2019-11-19 ENCOUNTER — Other Ambulatory Visit: Payer: Self-pay

## 2019-11-19 ENCOUNTER — Ambulatory Visit (INDEPENDENT_AMBULATORY_CARE_PROVIDER_SITE_OTHER): Payer: Federal, State, Local not specified - PPO | Admitting: Family Medicine

## 2019-11-19 VITALS — BP 119/71 | HR 73 | Temp 98.1°F | Ht 65.0 in | Wt 217.0 lb

## 2019-11-19 DIAGNOSIS — E038 Other specified hypothyroidism: Secondary | ICD-10-CM | POA: Diagnosis not present

## 2019-11-19 DIAGNOSIS — Z6836 Body mass index (BMI) 36.0-36.9, adult: Secondary | ICD-10-CM

## 2019-11-19 DIAGNOSIS — Z9189 Other specified personal risk factors, not elsewhere classified: Secondary | ICD-10-CM | POA: Diagnosis not present

## 2019-11-19 DIAGNOSIS — E559 Vitamin D deficiency, unspecified: Secondary | ICD-10-CM | POA: Diagnosis not present

## 2019-11-19 DIAGNOSIS — E039 Hypothyroidism, unspecified: Secondary | ICD-10-CM

## 2019-11-19 MED ORDER — VITAMIN D (ERGOCALCIFEROL) 1.25 MG (50000 UNIT) PO CAPS: ORAL_CAPSULE | ORAL | 0 refills | Status: DC

## 2019-11-20 DIAGNOSIS — D2271 Melanocytic nevi of right lower limb, including hip: Secondary | ICD-10-CM | POA: Diagnosis not present

## 2019-11-20 DIAGNOSIS — D2261 Melanocytic nevi of right upper limb, including shoulder: Secondary | ICD-10-CM | POA: Diagnosis not present

## 2019-11-20 DIAGNOSIS — D225 Melanocytic nevi of trunk: Secondary | ICD-10-CM | POA: Diagnosis not present

## 2019-11-20 DIAGNOSIS — D2262 Melanocytic nevi of left upper limb, including shoulder: Secondary | ICD-10-CM | POA: Diagnosis not present

## 2019-11-20 LAB — TSH: TSH: 0.023 u[IU]/mL — ABNORMAL LOW (ref 0.450–4.500)

## 2019-11-20 LAB — T3, FREE: T3, Free: 4.8 pg/mL — ABNORMAL HIGH (ref 2.0–4.4)

## 2019-11-20 LAB — T4, FREE: Free T4: 0.97 ng/dL (ref 0.82–1.77)

## 2019-11-21 NOTE — Progress Notes (Signed)
Chief Complaint:   OBESITY Destiny [name] is here to discuss her progress with her obesity treatment plan along with follow-up of her obesity related diagnoses. Destiny [name] is on the Category 2 Plan and states she is following her eating plan approximately 85% of the time. Destiny [name] states she is using the treadmill for 90 minutes 4 days per week and doing pilates for 15 minutes 3 times per week.  Today's visit was #: 4 Starting weight: 229 lbs Starting date: 10/04/2019 Today's weight: 217 lbs Today's date: 11/19/2019 Total lbs lost to date: 12 lbs Total lbs lost since last in-office visit: 2 lbs  Interim History: Destiny [name] went to her in-law's funeral this past weekend.  She says she did well despite "all the food around".  PC/J[place]ita when she did not eat on plan.  All the food is out of the house now.  She has meal planned for the week as well.  Subjective:   1. Hypothyroidism, unspecified type Destiny [name] is taking Armour Thyroid per [facility].  Denies concerns or complaints with current regimen, which we decreased about 6 weeks ago due to over replacement by outside provider  Lab Results  Component Value Date   TSH 0.023 (L) 11/19/2019   2. Vitamin D deficiency [place]'s Vitamin D level was 96.6 on 10/04/2019. She is currently taking prescription vitamin D 50,000 IU every 10 days. She denies nausea, vomiting or muscle weakness.  3. At risk for side effect of medication Destiny [name] is at risk for side effect of her thyroid medications.  Assessment/Plan:   1. Hypothyroidism, unspecified type Patient with [place]-standing hypothyroidism, on levothyroxine therapy. She appears euthyroid. Orders and follow up as documented in patient record.  Recheck thyroid panel today.  Continue current dose for now until results come back.  Counseling  Good thyroid control is important for overall health. Supratherapeutic thyroid levels are dangerous and will not improve weight loss results.  The correct way to  take levothyroxine is fasting, with water, separated by at least 30 minutes from breakfast, and separated by more than 4 hours from calcium, iron, multivitamins, acid reflux medications (PPIs).   - TSH - T4, free - T3, free  2. Vitamin D deficiency Low Vitamin D level contributes to fatigue and are associated with obesity, breast, and colon cancer. She agrees to continue to take prescription Vitamin D @50 ,000 IU every 10 days and will follow-up for routine testing of Vitamin D, at least 2-3 times per year to avoid over-replacement.  -Refill Vitamin D, Ergocalciferol, (DRISDOL) 1.25 MG (50000 UNIT) CAPS capsule; Take one tablet q 10 days  Dispense: 3 capsule; Refill: 0  3. At risk for side effect of medication Destiny [name] was given approximately 15 minutes of drug side effect counseling today.  We discussed side effect possibility and risk versus benefits. Destiny [name] agreed to the medication and will contact this office if these side effects are intolerable.  4. Class 2 severe obesity with serious comorbidity and body mass index (BMI) of 36.0 to 36.9 in adult, unspecified obesity type (HCC) Destiny [name] is currently in the action stage of change. As such, her goal is to continue with weight loss efforts. She has agreed to the Category 2 Plan.   Exercise goals: As is.  Behavioral modification strategies: increasing lean protein intake, decreasing simple carbohydrates, meal planning and cooking strategies and planning for success.  Destiny [name] has agreed to follow-up with our clinic in 2 weeks. She was informed of the importance of frequent follow-up visits to  maximize her success with intensive lifestyle modifications for her multiple health conditions.   Destiny [name] was informed we would discuss her lab results at her next visit unless there is a critical issue that needs to be addressed sooner. Destiny [name] agreed to keep her next visit at the agreed upon time to discuss these results.  Objective:   Blood pressure  119/71, pulse 73, temperature 98.1 F (36.7 C), height 5\' 5"  (1.651 m), weight 217 lb (98.4 kg), SpO2 98 %. Body mass index is 36.11 kg/m.  General: Cooperative, alert, well developed, in no acute distress. HEENT: Conjunctivae and lids unremarkable. Cardiovascular: Regular rhythm.  Lungs: Normal work of breathing. Neurologic: No focal deficits.   Lab Results  Component Value Date   CREATININE 0.71 10/04/2019   BUN 14 10/04/2019   NA 140 10/04/2019   K 4.7 10/04/2019   CL 101 10/04/2019   CO2 26 10/04/2019   Lab Results  Component Value Date   ALT 12 10/04/2019   AST 15 10/04/2019   ALKPHOS 67 10/04/2019   BILITOT 0.5 10/04/2019   Lab Results  Component Value Date   HGBA1C 5.6 10/04/2019   Lab Results  Component Value Date   INSULIN 8.4 10/04/2019   Lab Results  Component Value Date   TSH 0.023 (L) 11/19/2019   Lab Results  Component Value Date   CHOL 232 (H) 10/04/2019   HDL 64 10/04/2019   LDLCALC 149 (H) 10/04/2019   TRIG 106 10/04/2019   CHOLHDL 3.6 10/04/2019   Lab Results  Component Value Date   WBC 6.4 10/04/2019   HGB 14.6 10/04/2019   HCT 42.1 10/04/2019   MCV 85 10/04/2019   PLT 352 10/04/2019   Lab Results  Component Value Date   IRON 109 10/04/2019   TIBC 355 10/04/2019   FERRITIN 74 10/04/2019   Attestation Statements:   Reviewed by clinician on day of visit: allergies, medications, problem list, medical history, surgical history, family history, social history, and previous encounter notes.  I, 12/04/2019, CMA, am acting as Insurance claims handler for Energy manager, DO.  I have reviewed the above documentation for accuracy and completeness, and I agree with the above. -  [employer], DO

## 2019-11-26 MED ORDER — THYROID 90 MG PO TABS: ORAL_TABLET | ORAL | 0 refills | Status: DC

## 2019-11-28 ENCOUNTER — Telehealth (INDEPENDENT_AMBULATORY_CARE_PROVIDER_SITE_OTHER): Payer: Federal, State, Local not specified - PPO | Admitting: Psychology

## 2019-11-28 DIAGNOSIS — F5089 Other specified eating disorder: Secondary | ICD-10-CM | POA: Diagnosis not present

## 2019-12-03 ENCOUNTER — Other Ambulatory Visit: Payer: Self-pay

## 2019-12-03 ENCOUNTER — Encounter (INDEPENDENT_AMBULATORY_CARE_PROVIDER_SITE_OTHER): Payer: Self-pay | Admitting: Family Medicine

## 2019-12-03 ENCOUNTER — Ambulatory Visit (INDEPENDENT_AMBULATORY_CARE_PROVIDER_SITE_OTHER): Payer: Federal, State, Local not specified - PPO | Admitting: Family Medicine

## 2019-12-03 VITALS — BP 106/73 | HR 69 | Temp 97.7°F | Ht 65.0 in | Wt 212.0 lb

## 2019-12-03 DIAGNOSIS — E039 Hypothyroidism, unspecified: Secondary | ICD-10-CM

## 2019-12-03 DIAGNOSIS — Z6835 Body mass index (BMI) 35.0-35.9, adult: Secondary | ICD-10-CM | POA: Diagnosis not present

## 2019-12-04 NOTE — Progress Notes (Signed)
Chief Complaint:   OBESITY Destiny [name] is here to discuss her progress with her obesity treatment plan along with follow-up of her obesity related diagnoses. Destiny [name] is on the Category 2 Plan and states she is following her eating plan approximately 90% of the time. Destiny [name] states she is doing pilates for 50 minutes 3 times per week and walking for 60 minutes 4 times per week.  Today's visit was #: 5 Starting weight: 229 lbs Starting date: 10/04/2019 Today's weight: 212 lbs Today's date: 12/03/2019 Total lbs lost to date: 17 lbs Total lbs lost since last in-office visit: 5 lbs  Interim History: Destiny [name] says the meal plan is working well for her.  She only ate off plan 2 times, in which she had pizza and pasta.  She is following the plan without issues and says she likes it.  Cravings and hunger are controlled.  For snacks, [place]asso bars and cheese sticks.  Assessment/Plan:   1. Hypothyroidism, unspecified type We reviewed labs.  Recently done.  She has too much supplement currently with Armour Thyroid.  Denies side effects of medication.  She was started on it by [facility].  Discussed labs with patient today.  We changed her dose to 90 mg around the end of September right after her last labs were done (see phone note/MyChart note).  Lab Results  Component Value Date   TSH 0.023 (L) 11/19/2019     2. Class 2 severe obesity with serious comorbidity and body mass index (BMI) of 35.0 to 35.9 in adult, unspecified obesity type [facility])  Destiny [name] is currently in the action stage of change. As such, her goal is to continue with weight loss efforts. She has agreed to the Category 2 Plan.   Exercise goals: As is.  Behavioral modification strategies: increasing lean protein intake, meal planning and cooking strategies, keeping healthy foods in the home, ways to avoid boredom eating, celebration eating strategies and planning for success.  Destiny [name] has agreed to follow-up with our  clinic in 2 weeks. She was informed of the importance of frequent follow-up visits to maximize her success with intensive lifestyle modifications for her multiple health conditions.   Objective:   Blood pressure 106/73, pulse 69, temperature 97.7 F (36.5 C), height 5\' 5"  (1.651 m), weight 212 lb (96.2 kg), SpO2 96 %. Body mass index is 35.28 kg/m.  General: Cooperative, alert, well developed, in no acute distress. HEENT: Conjunctivae and lids unremarkable. Cardiovascular: Regular rhythm.  Lungs: Normal work of breathing. Neurologic: No focal deficits.   Lab Results  Component Value Date   CREATININE 0.71 10/04/2019   BUN 14 10/04/2019   NA 140 10/04/2019   K 4.7 10/04/2019   CL 101 10/04/2019   CO2 26 10/04/2019   Lab Results  Component Value Date   ALT 12 10/04/2019   AST 15 10/04/2019   ALKPHOS 67 10/04/2019   BILITOT 0.5 10/04/2019   Lab Results  Component Value Date   HGBA1C 5.6 10/04/2019   Lab Results  Component Value Date   INSULIN 8.4 10/04/2019   Lab Results  Component Value Date   TSH 0.023 (L) 11/19/2019   Lab Results  Component Value Date   CHOL 232 (H) 10/04/2019   HDL 64 10/04/2019   LDLCALC 149 (H) 10/04/2019   TRIG 106 10/04/2019   CHOLHDL 3.6 10/04/2019   Lab Results  Component Value Date   WBC 6.4 10/04/2019   HGB 14.6 10/04/2019   HCT 42.1 10/04/2019  MCV 85 10/04/2019   PLT 352 10/04/2019   Lab Results  Component Value Date   IRON 109 10/04/2019   TIBC 355 10/04/2019   FERRITIN 74 10/04/2019   Attestation Statements:   Reviewed by clinician on day of visit: allergies, medications, problem list, medical history, surgical history, family history, social history, and previous encounter notes.  Time spent on visit including pre-visit chart review and post-visit care and charting was 20 minutes.   I, Insurance claims handler, CMA, am acting as Energy manager for [employer], DO.  I have reviewed the above documentation for accuracy  and completeness, and I agree with the above. Thomasene Lot, DO

## 2019-12-12 ENCOUNTER — Telehealth (INDEPENDENT_AMBULATORY_CARE_PROVIDER_SITE_OTHER): Payer: Federal, State, Local not specified - PPO | Admitting: Psychology

## 2019-12-12 DIAGNOSIS — F5089 Other specified eating disorder: Secondary | ICD-10-CM

## 2019-12-12 NOTE — Progress Notes (Signed)
  Office: [phone]  /  Fax: [phone]    Date: December 12, 2019    Appointment Start Time: 10:01am Duration: 22 minutes Provider: Lawerance Cruel, Psy.D. Type of Session: Individual Therapy  Location of Patient: Home Location of Provider: Provider's Home Type of Contact: Telepsychological Visit via MyChart Video Visit  Session Content: Destiny [name] is a 49 y.o. female presenting for a follow-up appointment to address the previously established treatment goal of increasing coping skills. Today's appointment was a telepsychological visit due to COVID-19. Destiny [name] provided verbal consent for today's telepsychological appointment and she is aware she is responsible for securing confidentiality on her end of the session. Prior to proceeding with today's appointment, Destiny [name]'s physical location at the time of this appointment was obtained as well a phone number she could be reached at in the event of technical difficulties. Destiny [name] and this provider participated in today's telepsychological service.   This provider conducted a brief check-in. Destiny [name] shared about recent events. She discussed making better choices and engaging in portion control on her birthday. Positive reinforcement was provided. Additionally, Destiny [name] described a reduction in emotional and binge eating behaviors. Moreover, psychoeducation regarding mindfulness was provided. A handout was provided to Destiny [name] with further information regarding mindfulness, including exercises. This provider also explained the benefit of mindfulness as it relates to emotional eating. Destiny [name] was encouraged to engage in the provided exercises between now and the next appointment with this provider. Destiny [name] agreed. During today's appointment, Destiny [name] was led through a mindfulness exercise involving her senses. Destiny [name] provided verbal consent during today's appointment for this provider to send a handout about mindfulness via e-mail. Furthermore, termination planning was  discussed. Destiny [name] was receptive to a follow-up appointment in 3 weeks and an additional follow-up/termination appointment in approximately 3 weeks after that. Overall,  Destiny [name] was receptive to today's appointment as evidenced by openness to sharing, responsiveness to feedback, and willingness to engage in mindfulness exercises to assist with coping.  Mental Status Examination:  Appearance: well groomed and appropriate hygiene  Behavior: appropriate to circumstances Mood: euthymic Affect: mood congruent Speech: normal in rate, volume, and tone Eye Contact: appropriate Psychomotor Activity: appropriate Gait: unable to assess Thought Process: linear, logical, and goal directed  Thought Content/Perception: no hallucinations, delusions, bizarre thinking or behavior reported or observed and no evidence of suicidal and homicidal ideation, plan, and intent Orientation: time, person, place, and purpose of appointment Memory/Concentration: memory, attention, language, and fund of knowledge intact  Insight/Judgment: good  Interventions:  Conducted a brief chart review Provided empathic reflections and validation Provided positive reinforcement Employed supportive psychotherapy interventions to facilitate reduced distress and to improve coping skills with identified stressors Psychoeducation provided regarding mindfulness Engaged patient in mindfulness exercise(s) Employed acceptance and commitment interventions to emphasize mindfulness and acceptance without struggle Discussed termination planning  DSM-5 Diagnosis(es): 307.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 Destiny [name] has demonstrated progress in her goal as evidenced by increased awareness of hunger patterns and increased awareness of triggers for  emotional eating. Destiny [name] also continues to demonstrate willingness to engage in learned skill(s).  Plan: The next appointment will be scheduled in three weeks, which will be via MyChart Video Visit. The next session will focus on working towards the established treatment goal.

## 2019-12-14 ENCOUNTER — Other Ambulatory Visit (INDEPENDENT_AMBULATORY_CARE_PROVIDER_SITE_OTHER): Payer: Self-pay | Admitting: Family Medicine

## 2019-12-14 DIAGNOSIS — E559 Vitamin D deficiency, unspecified: Secondary | ICD-10-CM

## 2019-12-17 ENCOUNTER — Other Ambulatory Visit: Payer: Self-pay

## 2019-12-17 ENCOUNTER — Ambulatory Visit (INDEPENDENT_AMBULATORY_CARE_PROVIDER_SITE_OTHER): Payer: Federal, State, Local not specified - PPO | Admitting: Family Medicine

## 2019-12-17 ENCOUNTER — Encounter (INDEPENDENT_AMBULATORY_CARE_PROVIDER_SITE_OTHER): Payer: Self-pay | Admitting: Family Medicine

## 2019-12-17 VITALS — BP 120/64 | HR 69 | Temp 98.0°F | Ht 65.0 in | Wt 212.0 lb

## 2019-12-17 DIAGNOSIS — E7849 Other hyperlipidemia: Secondary | ICD-10-CM | POA: Diagnosis not present

## 2019-12-17 DIAGNOSIS — E8881 Metabolic syndrome: Secondary | ICD-10-CM | POA: Diagnosis not present

## 2019-12-17 DIAGNOSIS — E038 Other specified hypothyroidism: Secondary | ICD-10-CM

## 2019-12-17 DIAGNOSIS — E559 Vitamin D deficiency, unspecified: Secondary | ICD-10-CM

## 2019-12-17 DIAGNOSIS — D508 Other iron deficiency anemias: Secondary | ICD-10-CM

## 2019-12-17 DIAGNOSIS — Z9189 Other specified personal risk factors, not elsewhere classified: Secondary | ICD-10-CM

## 2019-12-17 DIAGNOSIS — E66812 Obesity, class 2: Secondary | ICD-10-CM

## 2019-12-17 DIAGNOSIS — Z6835 Body mass index (BMI) 35.0-35.9, adult: Secondary | ICD-10-CM

## 2019-12-17 DIAGNOSIS — E88819 Insulin resistance, unspecified: Secondary | ICD-10-CM

## 2019-12-17 MED ORDER — VITAMIN D (ERGOCALCIFEROL) 1.25 MG (50000 UNIT) PO CAPS: ORAL_CAPSULE | ORAL | 0 refills | Status: DC

## 2019-12-19 NOTE — Progress Notes (Signed)
Chief Complaint:   OBESITY Destiny [name] is here to discuss her progress with her obesity treatment plan along with follow-up of her obesity related diagnoses. Destiny [name] is on the Category 2 Plan and states she is following her eating plan approximately 75% of the time. Destiny [name] states she is doing pilates for 45 minutes 3 times per week and walking for 60 minutes 4 times per week.  Today's visit was #: 6 Starting weight: 229 lbs Starting date: 10/04/2019 Today's weight: 212 lbs Today's date: 12/17/2019 Total lbs lost to date: 17 lbs Total lbs lost since last in-office visit: 0  Interim History: Destiny [name] says, "I was happy I didn't gain and knew I made some bad decisions".  She says she has felt "blah" and had her period, thus she ate more the week prior to getting it.  She says that for 2-3 days she gets really hungry.  They ate out more over the past 2 weeks.  She got fried seafood the other night, but had healthier sides with her meal.  Assessment/Plan:   1. Other specified hypothyroidism Destiny [name] is on Armour Thyroid 90 mg daily.  She is tolerating the lower dose well.  Plan:  Recheck thyroid levels in mid November.  Lab Results  Component Value Date   TSH 0.023 (L) 11/19/2019   - TSH - T4 - T3  2. Vitamin D deficiency Destiny [name] has a history of Vitamin D deficiency with resultant generalized fatigue as her primary symptom.  she is taking OTC vitamin D 7500 IU daily for this deficiency and tolerating it well without side-effect.   Most recent Vitamin D lab reviewed-  level: 96.6.  Plan:   - Discussed importance of vitamin D (as well as calcium) to their health and well-being.   - We reviewed possible symptoms of low Vitamin D including low energy, depressed mood, muscle aches, joint aches, osteoporosis etc.  - We discussed that low Vitamin D levels may be linked to an increased risk of cardiovascular events and even increased risk of cancers- such as colon and breast.   -  Educated pt that weight loss will likely improve availability of vitamin D, thus encouraged Destiny [name] to continue with meal plan and their weight loss efforts to further improve this condition  - I recommend pt take a weekly prescription vit D- see script below- which pt agrees to after discussion of risks and benefits of this medication.      - Informed patient this may be a lifelong thing, and she was encouraged to continue to take the medicine until pt told otherwise.   We will need to monitor levels regularly ( q 3-4 mo on average )  to keep levels within normal limits.   - All pt's questions and concerns regarding this condition addressed  -Refill Vitamin D, Ergocalciferol, (DRISDOL) 1.25 MG (50000 UNIT) CAPS capsule; Take one tablet q 10 days  Dispense: 3 capsule; Refill: 0 - VITAMIN D 25 Hydroxy (Vit-D Deficiency, Fractures)  3. Other hyperlipidemia Diet controlled.  Plan:  Check FLP around November 10th or so.  Continue prudent nutritional plan and decrease fried foods/saturated fats.  The 10-year ASCVD risk score Destiny George DC Jr., et al., 2013) is: 0.9%   Values used to calculate the score:     Age: 49 years     Sex: Female     Is Non-Hispanic African American: No     Diabetic: No     Tobacco smoker: No  Systolic Blood Pressure: 120 mmHg     Is BP treated: No     HDL Cholesterol: 64 mg/dL     Total Cholesterol: 232 mg/dL  Lab Results  Component Value Date   ALT 12 10/04/2019   AST 15 10/04/2019   ALKPHOS 67 10/04/2019   BILITOT 0.5 10/04/2019   Lab Results  Component Value Date   CHOL 232 (H) 10/04/2019   HDL 64 10/04/2019   LDLCALC 149 (H) 10/04/2019   TRIG 106 10/04/2019   CHOLHDL 3.6 10/04/2019   - Lipid Panel With LDL/HDL Ratio  4. Insulin resistance Destiny [name] has a diagnosis of insulin resistance based on her elevated fasting insulin level >5. She continues to work on diet and exercise to decrease her risk of diabetes.  Plan:  Check A1c and FI around November  10th.  Continue prudent nutritional plan and decrease simple carbs.  Lab Results  Component Value Date   INSULIN 8.4 10/04/2019   Lab Results  Component Value Date   HGBA1C 5.6 10/04/2019   - Hemoglobin A1c - Insulin, random  5. Other iron deficiency anemia She is taking a daily iron supplement.  Plan:  Recheck CBC around November 10th along with iron supplement.  CBC Latest Ref Rng & Units 10/04/2019 11/02/2017 11/01/2017  WBC 3.4 - 10.8 x10E3/uL 6.4 12.7(H) 13.2(H)  Hemoglobin 11.1 - 15.9 g/dL 58.5 2.7(P) 11.9(L)  Hematocrit 34.0 - 46.6 % 42.1 28.1(L) 34.6(L)  Platelets 150 - 450 x10E3/uL 352 193 203   Lab Results  Component Value Date   IRON 109 10/04/2019   TIBC 355 10/04/2019   FERRITIN 74 10/04/2019   Lab Results  Component Value Date   VITAMINB12 960 10/04/2019   - CBC with Differential/Platelet - Fe+TIBC+Fer  6. At risk for side effect of medication Destiny [name] was given approximately 12 minutes of drug side effect counseling today.  We discussed side effect possibility and risk versus benefits. Destiny [name] agreed to the medication and will contact this office if these side effects are intolerable.  7. Class 2 severe obesity with serious comorbidity and body mass index (BMI) of 35.0 to 35.9 in adult, unspecified obesity type (HCC)  Destiny [name] is currently in the action stage of change. As such, her goal is to continue with weight loss efforts. She has agreed to the Category 2 Plan.   Exercise goals: For substantial health benefits, adults should do at least 150 minutes (2 hours and 30 minutes) a week of moderate-intensity, or 75 minutes (1 hour and 15 minutes) a week of vigorous-intensity aerobic physical activity, or an equivalent combination of moderate- and vigorous-intensity aerobic activity. Aerobic activity should be performed in episodes of at least 10 minutes, and preferably, it should be spread throughout the week.  She has not been consistent recently, and we encouraged at  least 100 minutes per week.  Behavioral modification strategies: increasing lean protein intake, decreasing simple carbohydrates, decreasing eating out, meal planning and cooking strategies, keeping healthy foods in the home, travel eating strategies and planning for success.  Destiny [name] has agreed to follow-up with our clinic in 2-3 weeks. She was informed of the importance of frequent follow-up visits to maximize her success with intensive lifestyle modifications for her multiple health conditions.   Objective:   Blood pressure 120/64, pulse 69, temperature 98 F (36.7 C), height 5\' 5"  (1.651 m), weight 212 lb (96.2 kg), SpO2 98 %. Body mass index is 35.28 kg/m.  General: Cooperative, alert, well developed, in no acute distress. HEENT: Conjunctivae  and lids unremarkable. Cardiovascular: Regular rhythm.  Lungs: Normal work of breathing. Neurologic: No focal deficits.   Lab Results  Component Value Date   CREATININE 0.71 10/04/2019   BUN 14 10/04/2019   NA 140 10/04/2019   K 4.7 10/04/2019   CL 101 10/04/2019   CO2 26 10/04/2019   Lab Results  Component Value Date   ALT 12 10/04/2019   AST 15 10/04/2019   ALKPHOS 67 10/04/2019   BILITOT 0.5 10/04/2019   Lab Results  Component Value Date   HGBA1C 5.6 10/04/2019   Lab Results  Component Value Date   INSULIN 8.4 10/04/2019   Lab Results  Component Value Date   TSH 0.023 (L) 11/19/2019   Lab Results  Component Value Date   CHOL 232 (H) 10/04/2019   HDL 64 10/04/2019   LDLCALC 149 (H) 10/04/2019   TRIG 106 10/04/2019   CHOLHDL 3.6 10/04/2019   Lab Results  Component Value Date   WBC 6.4 10/04/2019   HGB 14.6 10/04/2019   HCT 42.1 10/04/2019   MCV 85 10/04/2019   PLT 352 10/04/2019   Lab Results  Component Value Date   IRON 109 10/04/2019   TIBC 355 10/04/2019   FERRITIN 74 10/04/2019   Attestation Statements:   Reviewed by clinician on day of visit: allergies, medications, problem list, medical history,  surgical history, family history, social history, and previous encounter notes.  I, Insurance claims handler, CMA, am acting as Energy manager for [employer], DO.  I have reviewed the above documentation for accuracy and completeness, and I agree with the above. Carlye Grippe, D.O.  The 21st Century Cures Act was signed into law in 2016 which includes the topic of electronic health records.  This provides immediate access to information in MyChart.  This includes consultation notes, operative notes, office notes, lab results and pathology reports.  If you have any questions about what you read please let us know at your next visit so we can discuss your concerns and take corrective action if need be.  We are right here with you.

## 2019-12-24 ENCOUNTER — Other Ambulatory Visit (INDEPENDENT_AMBULATORY_CARE_PROVIDER_SITE_OTHER): Payer: Self-pay | Admitting: Family Medicine

## 2019-12-24 DIAGNOSIS — E038 Other specified hypothyroidism: Secondary | ICD-10-CM

## 2019-12-31 ENCOUNTER — Encounter (INDEPENDENT_AMBULATORY_CARE_PROVIDER_SITE_OTHER): Payer: Self-pay | Admitting: Family Medicine

## 2019-12-31 ENCOUNTER — Other Ambulatory Visit: Payer: Self-pay

## 2019-12-31 ENCOUNTER — Ambulatory Visit (INDEPENDENT_AMBULATORY_CARE_PROVIDER_SITE_OTHER): Payer: Federal, State, Local not specified - PPO | Admitting: Family Medicine

## 2019-12-31 VITALS — BP 115/71 | HR 69 | Temp 97.6°F | Ht 65.0 in | Wt 207.0 lb

## 2019-12-31 DIAGNOSIS — D508 Other iron deficiency anemias: Secondary | ICD-10-CM

## 2019-12-31 DIAGNOSIS — E038 Other specified hypothyroidism: Secondary | ICD-10-CM | POA: Diagnosis not present

## 2019-12-31 DIAGNOSIS — E559 Vitamin D deficiency, unspecified: Secondary | ICD-10-CM

## 2019-12-31 DIAGNOSIS — Z6834 Body mass index (BMI) 34.0-34.9, adult: Secondary | ICD-10-CM

## 2019-12-31 DIAGNOSIS — Z9189 Other specified personal risk factors, not elsewhere classified: Secondary | ICD-10-CM

## 2019-12-31 DIAGNOSIS — E669 Obesity, unspecified: Secondary | ICD-10-CM | POA: Diagnosis not present

## 2019-12-31 MED ORDER — THYROID 90 MG PO TABS: ORAL_TABLET | ORAL | 0 refills | Status: DC

## 2020-01-01 NOTE — Progress Notes (Addendum)
Chief Complaint:   OBESITY Destiny [name] is here to discuss her progress with her obesity treatment plan along with follow-up of her obesity related diagnoses. Destiny [name] is on the Category 2 Plan and states she is following her eating plan approximately 95% of the time. Destiny [name] states she is walking, doing pilates, and running for 50 minutes 3-4 times per week.  Today's visit was #: 7 Starting weight: 229 lbs Starting date: 10/04/2019 Today's weight: 207 lbs Today's date: 12/31/2019 Total lbs lost to date: 22 lbs Total lbs lost since last in-office visit: 5 lbs Total weight loss percentage to date: -9.61%  Interim History: Destiny [name] only had 1 meal off plan and 2 beers last night.  Otherwise, she ate completely on plan.   Assessment/Plan:   Meds ordered this encounter  Medications  . thyroid (ARMOUR THYROID) 90 MG tablet    Sig: TAKE 1 EVERY DAY    Dispense:  30 tablet    Refill:  0   We will obtain cbc, anemia panel, A1c, F.I., FLP, vit D, TSH, T3, FT4 near future.  Pt knows to come fasting.   Orders Placed This Encounter  Procedures  . T4, free     1. Other specified hypothyroidism She is taking Armour Thyroid 90 mg daily.  No new symptoms or concerns.  No issues with medication.  She has been on the new dose since 11/26/2019.  Needs refill.  Plan:  Refill Armour Thyroid, as per below.  Recheck labs in the near future.We will obtain TSH, T3, FT4 near future.   Lab Results  Component Value Date   TSH 0.023 (L) 11/19/2019   -Refill thyroid (ARMOUR THYROID) 90 MG tablet; TAKE 1 EVERY DAY  Dispense: 30 tablet; Refill: 0 - T4, free    2. Other iron deficiency anemia She is tolerating iron supplement well.    Plan:  Recheck labs- cbc and anemia panel in the near future.  CBC Latest Ref Rng & Units 10/04/2019 11/02/2017 11/01/2017  WBC 3.4 - 10.8 x10E3/uL 6.4 12.7(H) 13.2(H)  Hemoglobin 11.1 - 15.9 g/dL 09.3 8.1(W) 11.9(L)  Hematocrit 34.0 - 46.6 % 42.1 28.1(L) 34.6(L)  Platelets  150 - 450 x10E3/uL 352 193 203   Lab Results  Component Value Date   IRON 109 10/04/2019   TIBC 355 10/04/2019   FERRITIN 74 10/04/2019   Lab Results  Component Value Date   VITAMINB12 960 10/04/2019      3. Vitamin D deficiency Taking vitamin D as written.  No s-e or concerns  Plan:  Recheck vitamin D level in the near future. Cont meds as written.  Denies need for RF - We will obtain cbc, anemia panel, A1c, FI, FLP, vit D, TSH, T3, FT4 near future.  Pt knows to come fasting     4.  At risk Metabolic Dysfunction Due to Destiny [name]'s current state of health and medical condition(s), they are at a significantly higher risk for impaired metabolic function.   This further also puts the patient at much greater risk to also subsequently develop cardiopulmonary conditions that can negatively affect patient's quality of life as well.  At least 9 minutes was spent on counseling Destiny [name] about these concerns today and I stressed the importance of reversing these risks factors.   Initial goal is to lose at least 5-10% of starting weight to help reduce risk factors.   Counseling: Intensive lifestyle modifications discussed with Destiny [name] as most appropriate first line treatment.  she will continue to work  on diet, exercise and weight loss efforts.  We will continue to reassess these conditions on a fairly regular basis in an attempt to decrease patient's overall morbidity and mortality     5. Class 1 obesity with serious comorbidity and body mass index (BMI) of 34.0 to 34.9 in adult, unspecified obesity type  Destiny [name] is currently in the action stage of change. As such, her goal is to continue with weight loss efforts. She has agreed to the Category 2 Plan.   Exercise goals: As is.  Behavioral modification strategies: meal planning and cooking strategies.  Destiny [name] has agreed to follow-up with our clinic in 2 weeks.  We will obtain cbc, anemia panel, A1c, F.I., FLP, vit D, TSH, T3, FT4 near future.  Pt  knows to come fasting    She was informed of the importance of frequent follow-up visits to maximize her success with intensive lifestyle modifications for her multiple health conditions.   Destiny [name] was informed we would discuss her lab results at her next visit unless there is a critical issue that needs to be addressed sooner. Destiny [name] agreed to keep her next visit at the agreed upon time to discuss these results.    Objective:   Blood pressure 115/71, pulse 69, temperature 97.6 F (36.4 C), height 5\' 5"  (1.651 m), weight 207 lb (93.9 kg), SpO2 97 %. Body mass index is 34.45 kg/m.  General: Cooperative, alert, well developed, in no acute distress. HEENT: Conjunctivae and lids unremarkable. Cardiovascular: Regular rhythm.  Lungs: Normal work of breathing. Neurologic: No focal deficits.   Lab Results  Component Value Date   CREATININE 0.71 10/04/2019   BUN 14 10/04/2019   NA 140 10/04/2019   K 4.7 10/04/2019   CL 101 10/04/2019   CO2 26 10/04/2019   Lab Results  Component Value Date   ALT 12 10/04/2019   AST 15 10/04/2019   ALKPHOS 67 10/04/2019   BILITOT 0.5 10/04/2019   Lab Results  Component Value Date   HGBA1C 5.6 10/04/2019   Lab Results  Component Value Date   INSULIN 8.4 10/04/2019   Lab Results  Component Value Date   TSH 0.023 (L) 11/19/2019   Lab Results  Component Value Date   CHOL 232 (H) 10/04/2019   HDL 64 10/04/2019   LDLCALC 149 (H) 10/04/2019   TRIG 106 10/04/2019   CHOLHDL 3.6 10/04/2019   Lab Results  Component Value Date   WBC 6.4 10/04/2019   HGB 14.6 10/04/2019   HCT 42.1 10/04/2019   MCV 85 10/04/2019   PLT 352 10/04/2019   Lab Results  Component Value Date   IRON 109 10/04/2019   TIBC 355 10/04/2019   FERRITIN 74 10/04/2019   Attestation Statements:   Reviewed by clinician on day of visit: allergies, medications, problem list, medical history, surgical history, family history, social history, and previous encounter  notes.  I, 12/04/2019, CMA, am acting as Insurance claims handler for Energy manager, DO.  I have reviewed the above documentation for accuracy and completeness, and I agree with the above. [employer], D.O.  The 21st Century Cures Act was signed into law in 2016 which includes the topic of electronic health records.  This provides immediate access to information in MyChart.  This includes consultation notes, operative notes, office notes, lab results and pathology reports.  If you have any questions about what you read please let 2017 know at your next visit so we can discuss your concerns and take corrective  action if need be.  We are right here with you.

## 2020-01-02 ENCOUNTER — Telehealth (INDEPENDENT_AMBULATORY_CARE_PROVIDER_SITE_OTHER): Payer: Federal, State, Local not specified - PPO | Admitting: Psychology

## 2020-01-02 ENCOUNTER — Telehealth (INDEPENDENT_AMBULATORY_CARE_PROVIDER_SITE_OTHER): Payer: Self-pay | Admitting: Psychology

## 2020-01-02 NOTE — Progress Notes (Unsigned)
Office: [phone]  /  Fax: [phone]    Date: January 02, 2020    Appointment Start Time: *** Duration: *** minutes Provider: Lawerance Cruel, Psy.D. Type of Session: Individual Therapy  Location of Patient: {gbptloc:23249} Location of Provider: Provider's Home Type of Contact: Telepsychological Visit via MyChart Video Visit  Session Content: This provider called Destiny [name] at 10:30am as she did not present for the telepsychological appointment. A HIPAA compliant voicemail was left requesting a call back. As such, today's appointment was initiated *** minutes late.  Destiny [name] is a 49 y.o. female presenting for a follow-up appointment to address the previously established treatment goal of increasing coping skills. Today's appointment was a telepsychological visit due to COVID-19. Destiny [name] provided verbal consent for today's telepsychological appointment and she is aware she is responsible for securing confidentiality on her end of the session. Prior to proceeding with today's appointment, Destiny [name]'s physical location at the time of this appointment was obtained as well a phone number she could be reached at in the event of technical difficulties. Destiny [name] and this provider participated in today's telepsychological service.   This provider conducted a brief check-in and verbally administered the PHQ-9 and GAD-7. ***Session focused further on mindfulness to assist with coping. *** Wallis was led through a mindfulness exercise (***) and her experience was processed. Destiny [name] provided verbal consent during today's appointment for this provider to send *** via e-mail. This provider discussed the utilization of YouTube for mindfulness exercises (e.g., exercises by Rhae Hammock).  Destiny [name] was receptive to today's appointment as evidenced by openness to sharing, responsiveness to feedback, and {gbreceptiveness:23401}.  Mental Status Examination:  Appearance: {Appearance:22431} Behavior: {Behavior:22445} Mood:  {gbmood:21757} Affect: {Affect:22436} Speech: {Speech:22432} Eye Contact: {Eye Contact:22433} Psychomotor Activity: {Motor Activity:22434} Gait: {gbgait:23404} Thought Process: {thought process:22448}  Thought Content/Perception: {disturbances:22451} Orientation: {Orientation:22437} Memory/Concentration: {gbcognition:22449} Insight/Judgment: {Insight:22446}  Structured Assessments Results: The Patient Health Questionnaire-9 (PHQ-9) is a self-report measure that assesses symptoms and severity of depression over the course of the last two weeks. Destiny [name] obtained a score of *** suggesting {GBPHQ9SEVERITY:21752}. Destiny [name] finds the endorsed symptoms to be {gbphq9difficulty:21754}. [0= Not at all; 1= Several days; 2= More than half the days; 3= Nearly every day] Little interest or pleasure in doing things ***  Feeling down, depressed, or hopeless ***  Trouble falling or staying asleep, or sleeping too much ***  Feeling tired or having little energy ***  Poor appetite or overeating ***  Feeling bad about yourself --- or that you are a failure or have let yourself or your family down ***  Trouble concentrating on things, such as reading the newspaper or watching television ***  Moving or speaking so slowly that other people could have noticed? Or the opposite --- being so fidgety or restless that you have been moving around a lot more than usual ***  Thoughts that you would be better off dead or hurting yourself in some way ***  PHQ-9 Score ***    The Generalized Anxiety Disorder-7 (GAD-7) is a brief self-report measure that assesses symptoms of anxiety over the course of the last two weeks. Destiny [name] obtained a score of *** suggesting {gbgad7severity:21753}. Destiny [name] finds the endorsed symptoms to be {gbphq9difficulty:21754}. [0= Not at all; 1= Several days; 2= Over half the days; 3= Nearly every day] Feeling nervous, anxious, on edge ***  Not being able to stop or control worrying ***  Worrying too  much about different things ***  Trouble relaxing ***  Being so restless that it's hard to sit still ***  Becoming easily annoyed or irritable ***  Feeling afraid as if something awful might happen ***  GAD-7 Score ***   Interventions:  {Interventions for Progress Notes:23405}  DSM-5 Diagnosis(es): 307.59 (F50.8) Other Specified Feeding or Eating Disorder, Emotional Eating Behaviors  Treatment Goal & Progress: During the initial appointment with this provider, the following treatment goal was established: increase coping skills. Destiny [name] has demonstrated progress in her goal as evidenced by {gbtxprogress:22839}. Destiny [name] also {gbtxprogress2:22951}.  Plan: The next appointment will be scheduled in {gbweeks:21758}, which will be {gbtxmodality:23402}. The next session will focus on {Plan for Next Appointment:23400}.

## 2020-01-02 NOTE — Telephone Encounter (Signed)
  Office: [phone]  /  Fax: [phone]  Date of Call: January 02, 2020  Time of Call: 10:03am Provider: Lawerance Cruel, PsyD  CONTENT:  This provider called [name] to check-in as she did not present for today's MyChart Video Visit appointment at 10:00am. A HIPAA compliant voicemail was left requesting a call back. Of note, this provider stayed on the MyChart Video Visit appointment for 5 minutes prior to signing off per the clinic's grace period policy.    PLAN: This provider will wait for Myonna to call back. No further follow-up planned by this provider.

## 2020-01-09 DIAGNOSIS — E7849 Other hyperlipidemia: Secondary | ICD-10-CM | POA: Diagnosis not present

## 2020-01-09 DIAGNOSIS — E8881 Metabolic syndrome: Secondary | ICD-10-CM | POA: Diagnosis not present

## 2020-01-09 DIAGNOSIS — E559 Vitamin D deficiency, unspecified: Secondary | ICD-10-CM | POA: Diagnosis not present

## 2020-01-09 DIAGNOSIS — D508 Other iron deficiency anemias: Secondary | ICD-10-CM | POA: Diagnosis not present

## 2020-01-09 DIAGNOSIS — E038 Other specified hypothyroidism: Secondary | ICD-10-CM | POA: Diagnosis not present

## 2020-01-10 LAB — CBC WITH DIFFERENTIAL/PLATELET
Basophils Absolute: 0 10*3/uL (ref 0.0–0.2)
Basos: 0 %
EOS (ABSOLUTE): 0.1 10*3/uL (ref 0.0–0.4)
Eos: 1 %
Hematocrit: 43.1 % (ref 34.0–46.6)
Hemoglobin: 14.2 g/dL (ref 11.1–15.9)
Immature Grans (Abs): 0 10*3/uL (ref 0.0–0.1)
Immature Granulocytes: 0 %
Lymphocytes Absolute: 2.9 10*3/uL (ref 0.7–3.1)
Lymphs: 41 %
MCH: 29.6 pg (ref 26.6–33.0)
MCHC: 32.9 g/dL (ref 31.5–35.7)
MCV: 90 fL (ref 79–97)
Monocytes Absolute: 0.4 10*3/uL (ref 0.1–0.9)
Monocytes: 6 %
Neutrophils Absolute: 3.7 10*3/uL (ref 1.4–7.0)
Neutrophils: 52 %
Platelets: 310 10*3/uL (ref 150–450)
RBC: 4.8 x10E6/uL (ref 3.77–5.28)
RDW: 12.3 % (ref 11.7–15.4)
WBC: 7.1 10*3/uL (ref 3.4–10.8)

## 2020-01-10 LAB — LIPID PANEL WITH LDL/HDL RATIO
Cholesterol, Total: 180 mg/dL (ref 100–199)
HDL: 50 mg/dL (ref >39)
LDL Chol Calc (NIH): 118 mg/dL — ABNORMAL HIGH (ref 0–99)
LDL/HDL Ratio: 2.4 ratio (ref 0.0–3.2)
Triglycerides: 62 mg/dL (ref 0–149)
VLDL Cholesterol Cal: 12 mg/dL (ref 5–40)

## 2020-01-10 LAB — TSH: TSH: 0.379 u[IU]/mL — ABNORMAL LOW (ref 0.450–4.500)

## 2020-01-10 LAB — T4: T4, Total: 4.5 ug/dL (ref 4.5–12.0)

## 2020-01-10 LAB — T3: T3, Total: 70 ng/dL — ABNORMAL LOW (ref 71–180)

## 2020-01-10 LAB — T4, FREE: Free T4: 0.82 ng/dL (ref 0.82–1.77)

## 2020-01-10 LAB — VITAMIN D 25 HYDROXY (VIT D DEFICIENCY, FRACTURES): Vit D, 25-Hydroxy: 78.2 ng/mL (ref 30.0–100.0)

## 2020-01-10 LAB — INSULIN, RANDOM: INSULIN: 10.7 u[IU]/mL (ref 2.6–24.9)

## 2020-01-10 LAB — HEMOGLOBIN A1C
Est. average glucose Bld gHb Est-mCnc: 114 mg/dL
Hgb A1c MFr Bld: 5.6 % (ref 4.8–5.6)

## 2020-01-14 ENCOUNTER — Encounter (INDEPENDENT_AMBULATORY_CARE_PROVIDER_SITE_OTHER): Payer: Self-pay | Admitting: Family Medicine

## 2020-01-14 ENCOUNTER — Ambulatory Visit (INDEPENDENT_AMBULATORY_CARE_PROVIDER_SITE_OTHER): Payer: Federal, State, Local not specified - PPO | Admitting: Family Medicine

## 2020-01-14 ENCOUNTER — Other Ambulatory Visit: Payer: Self-pay

## 2020-01-14 VITALS — BP 93/62 | HR 85 | Temp 97.5°F | Ht 65.0 in | Wt 204.0 lb

## 2020-01-14 DIAGNOSIS — E559 Vitamin D deficiency, unspecified: Secondary | ICD-10-CM

## 2020-01-14 DIAGNOSIS — Z862 Personal history of diseases of the blood and blood-forming organs and certain disorders involving the immune mechanism: Secondary | ICD-10-CM

## 2020-01-14 DIAGNOSIS — E8881 Metabolic syndrome: Secondary | ICD-10-CM

## 2020-01-14 DIAGNOSIS — E039 Hypothyroidism, unspecified: Secondary | ICD-10-CM | POA: Insufficient documentation

## 2020-01-14 DIAGNOSIS — E7849 Other hyperlipidemia: Secondary | ICD-10-CM | POA: Diagnosis not present

## 2020-01-14 DIAGNOSIS — Z9189 Other specified personal risk factors, not elsewhere classified: Secondary | ICD-10-CM | POA: Diagnosis not present

## 2020-01-14 DIAGNOSIS — E669 Obesity, unspecified: Secondary | ICD-10-CM

## 2020-01-14 DIAGNOSIS — E038 Other specified hypothyroidism: Secondary | ICD-10-CM

## 2020-01-14 DIAGNOSIS — Z6834 Body mass index (BMI) 34.0-34.9, adult: Secondary | ICD-10-CM

## 2020-01-14 MED ORDER — VITAMIN D (ERGOCALCIFEROL) 1.25 MG (50000 UNIT) PO CAPS: ORAL_CAPSULE | ORAL | 0 refills | Status: DC

## 2020-01-14 NOTE — Telephone Encounter (Signed)
Dr Opalski, please advise  

## 2020-01-16 NOTE — Progress Notes (Signed)
Chief Complaint:   OBESITY Destiny [name] is here to discuss her progress with her obesity treatment plan along with follow-up of her obesity related diagnoses. Destiny [name] is on the Category 2 Plan and states she is following her eating plan approximately 90% of the time. Destiny [name] states she is walking/pilates 50 minutes 4-5 times per week.  Today's visit was #: 8 Starting weight: 229 lbs Starting date: 10/04/2019 Today's weight: 204 lbs Today's date: 01/14/2020 Total lbs lost to date: 25 lbs Total lbs lost since last in-office visit: 3 lbs Total weight loss percentage to date: -10.29%  Interim History: Destiny [name]'s PCP is Destiny [name]. SHe would like to change to a new one.   Plan:  I rec pt stay w/in the [facility] system.  Destiny [name] will call Dr. Rennis Golden office for a new patient appointment (her mom sees her as well).  Assessment/Plan:   1. Other hyperlipidemia Discussed labs with patient today.   LDL has gone from 149 to 118 at this time.   She is on no medication.   Plan:   Decrease saturated and trans fats, increase fish as she was eating prior (due to decreased HDL), and continue weight loss.  Lab Results  Component Value Date   ALT 12 10/04/2019   AST 15 10/04/2019   ALKPHOS 67 10/04/2019   BILITOT 0.5 10/04/2019   Lab Results  Component Value Date   CHOL 180 01/09/2020   HDL 50 01/09/2020   LDLCALC 118 (H) 01/09/2020   TRIG 62 01/09/2020   CHOLHDL 3.6 10/04/2019     2. Other specified hypothyroidism Discussed labs with patient today.  Destiny [name] is taking Armour Thyroid 90 mg daily.  Levels and labs improving and almost within normal limits now.  She is asymptomatic.  Feeling well.  Plan:  Improving values.   Continue with current regimen of medication.  Recheck levels in 3 months or sooner if issues/concerns develop.  She will also obtain a PCP by then.  Lab Results  Component Value Date   TSH 0.379 (L) 01/09/2020     3. Insulin resistance Discussed labs  with patient today.  Destiny [name] has a diagnosis of insulin resistance based on her elevated fasting insulin level >5. She continues to work on diet and exercise to decrease her risk of diabetes.  Plan:  [name] will continue to work on weight loss, exercise, and decreasing simple carbohydrates to help decrease the risk of diabetes. Destiny [name] agreed to follow-up with [place] as directed to closely monitor her progress.  Lab Results  Component Value Date   INSULIN 10.7 01/09/2020   INSULIN 8.4 10/04/2019   Lab Results  Component Value Date   HGBA1C 5.6 01/09/2020     4. Vitamin D deficiency Discussed labs with patient today.  Destiny [name]'s Vitamin D level was 78.2 on 01/09/2020. She is currently taking prescription vitamin D 50,000 IU each week. She denies nausea, vomiting or muscle weakness.  Plan:   - Reiterated importance of vitamin D (as well as calcium) to their health and wellbeing.  - Reminded pt that weight loss will likely improve availability of vitamin D, thus encouraged [name] to continue with meal plan and their weight loss efforts to further improve this condition. - I recommend pt continue to take weekly prescription vit D 50,000 IU - Informed patient this may be a lifelong thing, and she was encouraged to continue to take the medicine until told otherwise.   - We will need to monitor levels regularly (every 3-4  mo on average) to keep levels within normal limits.  - pt's questions and concerns regarding this condition addressed.  -Refill Vitamin D, Ergocalciferol, (DRISDOL) 1.25 MG (50000 UNIT) CAPS capsule; Take one tablet q 10 days  Dispense: 3 capsule; Refill: 0    5. History of iron deficiency anemia Discussed labs with patient today.  Destiny [name] is taking ferrous sulfate 325 mg daily. Tolerating well.    is not a vegetarian.  She does not have a history of weight loss surgery.   Plan:  Continue supplement as is.  Labs stable.  CBC Latest Ref Rng & Units 01/09/2020 10/04/2019 11/02/2017    WBC 3.4 - 10.8 x10E3/uL 7.1 6.4 12.7(H)  Hemoglobin 11.1 - 15.9 g/dL 16.9 67.8 9.3(Y)  Hematocrit 34.0 - 46.6 % 43.1 42.1 28.1(L)  Platelets 150 - 450 x10E3/uL 310 352 193   Lab Results  Component Value Date   IRON 109 10/04/2019   TIBC 355 10/04/2019   FERRITIN 74 10/04/2019   Lab Results  Component Value Date   VITAMINB12 960 10/04/2019    6. At risk for heart disease Due to Destiny [name]'s current state of health and medical condition(s), she is at a higher risk for heart disease.   This puts the patient at much greater risk to subsequently develop cardiopulmonary conditions that can significantly affect patient's quality of life in a negative manner as well.    At least 9 minutes was spent on counseling Destiny [name] about these concerns today and I stressed the importance of reversing risks factors of obesity, esp truncal and visceral fat, hypertension, hyperlipidemia, pre-diabetes.   Initial goal is to lose at least 5-10% of starting weight to help reduce these risk factors.   Counseling: Intensive lifestyle modifications discussed with [name] as most appropriate first line treatment.  she will continue to work on diet, exercise and weight loss efforts.  We will continue to reassess these conditions on a fairly regular basis in an attempt to decrease patient's overall morbidity and mortality.    7. Class 1 obesity with serious comorbidity and body mass index (BMI) of 34.0 to 34.9 in adult, unspecified obesity type  Destiny [name] is currently in the action stage of change. As such, her goal is to continue with weight loss efforts. She has agreed to the Category 2 Plan.   Exercise goals: As is.  Behavioral modification strategies: increasing lean protein intake (increase fish as well), meal planning and cooking strategies, travel eating strategies, holiday eating strategies  and planning for success.  Destiny [name] has agreed to follow-up with our clinic in 2-3 weeks. She was informed of the importance of  frequent follow-up visits to maximize her success with intensive lifestyle modifications for her multiple health conditions.     Objective:   Blood pressure 93/62, pulse 85, temperature (!) 97.5 F (36.4 C), height 5\' 5"  (1.651 m), weight 204 lb (92.5 kg), SpO2 96 %. Body mass index is 33.95 kg/m.  General: Cooperative, alert, well developed, in no acute distress. HEENT: Conjunctivae and lids unremarkable. Cardiovascular: Regular rhythm.  Lungs: Normal work of breathing. Neurologic: No focal deficits.   Lab Results  Component Value Date   CREATININE 0.71 10/04/2019   BUN 14 10/04/2019   NA 140 10/04/2019   K 4.7 10/04/2019   CL 101 10/04/2019   CO2 26 10/04/2019   Lab Results  Component Value Date   ALT 12 10/04/2019   AST 15 10/04/2019   ALKPHOS 67 10/04/2019   BILITOT 0.5 10/04/2019   Lab  Results  Component Value Date   HGBA1C 5.6 01/09/2020   HGBA1C 5.6 10/04/2019   Lab Results  Component Value Date   INSULIN 10.7 01/09/2020   INSULIN 8.4 10/04/2019   Lab Results  Component Value Date   TSH 0.379 (L) 01/09/2020   Lab Results  Component Value Date   CHOL 180 01/09/2020   HDL 50 01/09/2020   LDLCALC 118 (H) 01/09/2020   TRIG 62 01/09/2020   CHOLHDL 3.6 10/04/2019   Lab Results  Component Value Date   WBC 7.1 01/09/2020   HGB 14.2 01/09/2020   HCT 43.1 01/09/2020   MCV 90 01/09/2020   PLT 310 01/09/2020   Lab Results  Component Value Date   IRON 109 10/04/2019   TIBC 355 10/04/2019   FERRITIN 74 10/04/2019   Attestation Statements:   Reviewed by clinician on day of visit: allergies, medications, problem list, medical history, surgical history, family history, social history, and previous encounter notes.  I, Insurance claims handler, CMA, am acting as Energy manager for [employer], DO.  I have reviewed the above documentation for accuracy and completeness, and I agree with the above. Carlye Grippe, D.O.  The 21st Century Cures Act was  signed into law in 2016 which includes the topic of electronic health records.  This provides immediate access to information in MyChart.  This includes consultation notes, operative notes, office notes, lab results and pathology reports.  If you have any questions about what you read please let us know at your next visit so we can discuss your concerns and take corrective action if need be.  We are right here with you.

## 2020-01-22 DIAGNOSIS — Z1231 Encounter for screening mammogram for malignant neoplasm of breast: Secondary | ICD-10-CM | POA: Diagnosis not present

## 2020-01-28 ENCOUNTER — Encounter (INDEPENDENT_AMBULATORY_CARE_PROVIDER_SITE_OTHER): Payer: Self-pay | Admitting: Family Medicine

## 2020-01-28 ENCOUNTER — Other Ambulatory Visit: Payer: Self-pay

## 2020-01-28 ENCOUNTER — Ambulatory Visit (INDEPENDENT_AMBULATORY_CARE_PROVIDER_SITE_OTHER): Payer: Federal, State, Local not specified - PPO | Admitting: Family Medicine

## 2020-01-28 VITALS — BP 106/67 | HR 61 | Temp 97.6°F | Ht 65.0 in | Wt 203.0 lb

## 2020-01-28 DIAGNOSIS — E038 Other specified hypothyroidism: Secondary | ICD-10-CM | POA: Diagnosis not present

## 2020-01-28 DIAGNOSIS — E669 Obesity, unspecified: Secondary | ICD-10-CM | POA: Diagnosis not present

## 2020-01-28 DIAGNOSIS — Z9189 Other specified personal risk factors, not elsewhere classified: Secondary | ICD-10-CM

## 2020-01-28 DIAGNOSIS — Z6833 Body mass index (BMI) 33.0-33.9, adult: Secondary | ICD-10-CM | POA: Diagnosis not present

## 2020-01-28 MED ORDER — THYROID 90 MG PO TABS: ORAL_TABLET | ORAL | 0 refills | Status: DC

## 2020-01-29 NOTE — Progress Notes (Signed)
Chief Complaint:   OBESITY Destiny [name] is here to discuss her progress with her obesity treatment plan along with follow-up of her obesity related diagnoses. Destiny [name] is on the Category 2 Plan and states she is following her eating plan approximately 80% of the time. Destiny [name] states she is walking 40-45 minutes 1-2 times per week.  Today's visit was #: 9 Starting weight: 229 lbs Starting date: 10/04/2019 Today's weight: 203 lbs Today's date: 01/28/2020 Total lbs lost to date: 26 lbs Total lbs lost since last in-office visit: 1 lb Total weight loss percentage to date: -11.35 lbs  Interim History: Destiny [name] states that her hunger and cravings are controlled when she is following the plan. She is surprised that she still lost weight and is happy with the results. Her exercise has fallen off lately.  Assessment/Plan:   1. Other specified hypothyroidism Discussed labs with patient today. Advised that labs are moving in the right direction and improving. Destiny [name] is prescribed Armour Thyroid. She denies symptoms or concerns.   Plan: Refill Armour Thyroid for 1 month, a per below. Recheck labs in 3 months and follow up with PCP.  Refill- thyroid (ARMOUR THYROID) 90 MG tablet; TAKE 1 EVERY DAY  Dispense: 30 tablet; Refill: 0  2. At risk for impaired metabolic function Destiny [name] was given approximately 9 minutes of impaired  metabolic function prevention counseling today. We discussed intensive lifestyle modifications today with an emphasis on specific nutrition and exercise instructions and strategies.   3. Class 1 obesity with serious comorbidity and body mass index (BMI) of 33.0 to 33.9 in adult, unspecified obesity type Destiny [name] is currently in the action stage of change. As such, her goal is to continue with weight loss efforts. She has agreed to the Category 2 Plan.   Exercise goals: As is  Behavioral modification strategies: increasing lean protein intake, decreasing simple carbohydrates, meal  planning and cooking strategies and planning for success.  Destiny [name] has agreed to follow-up with our clinic in 2-3 weeks. She was informed of the importance of frequent follow-up visits to maximize her success with intensive lifestyle modifications for her multiple health conditions.   Objective:   Blood pressure 106/67, pulse 61, temperature 97.6 F (36.4 C), height 5\' 5"  (1.651 m), weight 203 lb (92.1 kg), SpO2 98 %. Body mass index is 33.78 kg/m.  General: Cooperative, alert, well developed, in no acute distress. HEENT: Conjunctivae and lids unremarkable. Cardiovascular: Regular rhythm.  Lungs: Normal work of breathing. Neurologic: No focal deficits.   Lab Results  Component Value Date   CREATININE 0.71 10/04/2019   BUN 14 10/04/2019   NA 140 10/04/2019   K 4.7 10/04/2019   CL 101 10/04/2019   CO2 26 10/04/2019   Lab Results  Component Value Date   ALT 12 10/04/2019   AST 15 10/04/2019   ALKPHOS 67 10/04/2019   BILITOT 0.5 10/04/2019   Lab Results  Component Value Date   HGBA1C 5.6 01/09/2020   HGBA1C 5.6 10/04/2019   Lab Results  Component Value Date   INSULIN 10.7 01/09/2020   INSULIN 8.4 10/04/2019   Lab Results  Component Value Date   TSH 0.379 (L) 01/09/2020   Lab Results  Component Value Date   CHOL 180 01/09/2020   HDL 50 01/09/2020   LDLCALC 118 (H) 01/09/2020   TRIG 62 01/09/2020   CHOLHDL 3.6 10/04/2019   Lab Results  Component Value Date   WBC 7.1 01/09/2020   HGB 14.2 01/09/2020   HCT  43.1 01/09/2020   MCV 90 01/09/2020   PLT 310 01/09/2020   Lab Results  Component Value Date   IRON 109 10/04/2019   TIBC 355 10/04/2019   FERRITIN 74 10/04/2019    Attestation Statements:   Reviewed by clinician on day of visit: allergies, medications, problem list, medical history, surgical history, family history, social history, and previous encounter notes.  Edmund Hilda, am acting as Energy manager for [employer], DO.  I have  reviewed the above documentation for accuracy and completeness, and I agree with the above. Carlye Grippe, D.O.  The 21st Century Cures Act was signed into law in 2016 which includes the topic of electronic health records.  This provides immediate access to information in MyChart.  This includes consultation notes, operative notes, office notes, lab results and pathology reports.  If you have any questions about what you read please let us know at your next visit so we can discuss your concerns and take corrective action if need be.  We are right here with you.

## 2020-02-18 ENCOUNTER — Encounter (INDEPENDENT_AMBULATORY_CARE_PROVIDER_SITE_OTHER): Payer: Self-pay | Admitting: Family Medicine

## 2020-02-18 ENCOUNTER — Other Ambulatory Visit: Payer: Self-pay

## 2020-02-18 ENCOUNTER — Ambulatory Visit (INDEPENDENT_AMBULATORY_CARE_PROVIDER_SITE_OTHER): Payer: Federal, State, Local not specified - PPO | Admitting: Family Medicine

## 2020-02-18 VITALS — BP 110/67 | HR 70 | Temp 98.0°F | Ht 65.0 in | Wt 197.0 lb

## 2020-02-18 DIAGNOSIS — Z6832 Body mass index (BMI) 32.0-32.9, adult: Secondary | ICD-10-CM

## 2020-02-18 DIAGNOSIS — E669 Obesity, unspecified: Secondary | ICD-10-CM

## 2020-02-18 DIAGNOSIS — Z9189 Other specified personal risk factors, not elsewhere classified: Secondary | ICD-10-CM

## 2020-02-18 DIAGNOSIS — E038 Other specified hypothyroidism: Secondary | ICD-10-CM

## 2020-02-18 DIAGNOSIS — E559 Vitamin D deficiency, unspecified: Secondary | ICD-10-CM | POA: Diagnosis not present

## 2020-02-18 MED ORDER — VITAMIN D (ERGOCALCIFEROL) 1.25 MG (50000 UNIT) PO CAPS: ORAL_CAPSULE | ORAL | 0 refills | Status: DC

## 2020-02-18 NOTE — Progress Notes (Signed)
Chief Complaint:   OBESITY Destiny [name] is here to discuss her progress with her obesity treatment plan along with follow-up of her obesity related diagnoses. Destiny [name] is on the Category 2 Plan and states she is following her eating plan approximately 90% of the time. Destiny [name] states she is doing pilates 50 minutes 2-3 times per week.  Today's visit was #: 10 Starting weight: 229 lbs Starting date: 10/04/2019 Today's weight: 197 lbs Today's date: 02/19/2020 Total lbs lost to date: 32 lbs Total lbs lost since last in-office visit: 6 lbs Total weight loss percentage to date: -13.97%  Interim History: It has been 3 weeks since Destiny [name]'s last OV. She denies having issues with the plan. She denies hunger or cravings.She is back to doing pilates 2-3 days a week.   Assessment/Plan:   1. Vitamin D deficiency Destiny [name]'s Vitamin D level was 78.2 on 01/09/2020. She is currently taking prescription vitamin D 50,000 IU each week. She denies nausea, vomiting or muscle weakness.   Ref. Range 01/09/2020 10:01  Vitamin D, 25-Hydroxy Latest Ref Range: 30.0 - 100.0 ng/mL 78.2   Plan: Refill Vit D for 1 month, as per below. Low Vitamin D level contributes to fatigue and are associated with obesity, breast, and colon cancer. She agrees to continue to take prescription Vitamin D @50 ,000 IU every week and will follow-up for routine testing of Vitamin D, at least 2-3 times per year to avoid over-replacement.  Refill- Vitamin D, Ergocalciferol, (DRISDOL) 1.25 MG (50000 UNIT) CAPS capsule; Take one tablet q 10 days  Dispense: 3 capsule; Refill: 0  2. Other specified hypothyroidism Destiny [name] is taking Armour Thyroid with no symptoms, concerns, or side effects.  Current symptoms: none.   Lab Results  Component Value Date   TSH 0.379 (L) 01/09/2020    Plan: Continue current treatment plan. Closely monitor. Destiny [name] has an appointment to establish with a PCP but her appointment isn't until Apr 03, 2020.    3. At risk  for impaired metabolic function Destiny [name] was given approximately 15 minutes of impaired  metabolic function prevention counseling today. We discussed intensive lifestyle modifications today with an emphasis on specific nutrition and exercise instructions and strategies.   4. Class 1 obesity with serious comorbidity and body mass index (BMI) of 32.0 to 32.9 in adult, unspecified obesity type Destiny [name] is currently in the action stage of change. As such, her goal is to continue with weight loss efforts. She has agreed to the Category 2 Plan.   Exercise goals: For substantial health benefits, adults should do at least 150 minutes (2 hours and 30 minutes) a week of moderate-intensity, or 75 minutes (1 hour and 15 minutes) a week of vigorous-intensity aerobic physical activity, or an equivalent combination of moderate- and vigorous-intensity aerobic activity. Aerobic activity should be performed in episodes of at least 10 minutes, and preferably, it should be spread throughout the week.  Behavioral modification strategies: no skipping meals, holiday eating strategies , celebration eating strategies and planning for success.  Destiny [name] has agreed to follow-up with our clinic in 2-3 weeks. She was informed of the importance of frequent follow-up visits to maximize her success with intensive lifestyle modifications for her multiple health conditions.   Objective:   Blood pressure 110/67, pulse 70, temperature 98 F (36.7 C), height 5\' 5"  (1.651 m), weight 197 lb (89.4 kg), SpO2 97 %. Body mass index is 32.78 kg/m.  General: Cooperative, alert, well developed, in no acute distress. HEENT: Conjunctivae and lids unremarkable. Cardiovascular:  Regular rhythm.  Lungs: Normal work of breathing. Neurologic: No focal deficits.   Lab Results  Component Value Date   CREATININE 0.71 10/04/2019   BUN 14 10/04/2019   NA 140 10/04/2019   K 4.7 10/04/2019   CL 101 10/04/2019   CO2 26 10/04/2019   Lab Results   Component Value Date   ALT 12 10/04/2019   AST 15 10/04/2019   ALKPHOS 67 10/04/2019   BILITOT 0.5 10/04/2019   Lab Results  Component Value Date   HGBA1C 5.6 01/09/2020   HGBA1C 5.6 10/04/2019   Lab Results  Component Value Date   INSULIN 10.7 01/09/2020   INSULIN 8.4 10/04/2019   Lab Results  Component Value Date   TSH 0.379 (L) 01/09/2020   Lab Results  Component Value Date   CHOL 180 01/09/2020   HDL 50 01/09/2020   LDLCALC 118 (H) 01/09/2020   TRIG 62 01/09/2020   CHOLHDL 3.6 10/04/2019   Lab Results  Component Value Date   WBC 7.1 01/09/2020   HGB 14.2 01/09/2020   HCT 43.1 01/09/2020   MCV 90 01/09/2020   PLT 310 01/09/2020   Lab Results  Component Value Date   IRON 109 10/04/2019   TIBC 355 10/04/2019   FERRITIN 74 10/04/2019    Attestation Statements:   Reviewed by clinician on day of visit: allergies, medications, problem list, medical history, surgical history, family history, social history, and previous encounter notes.  Edmund Hilda, am acting as Energy manager for [employer], DO.  I have reviewed the above documentation for accuracy and completeness, and I agree with the above. Carlye Grippe, D.O.  The 21st Century Cures Act was signed into law in 2016 which includes the topic of electronic health records.  This provides immediate access to information in MyChart.  This includes consultation notes, operative notes, office notes, lab results and pathology reports.  If you have any questions about what you read please let us know at your next visit so we can discuss your concerns and take corrective action if need be.  We are right here with you.

## 2020-03-03 ENCOUNTER — Other Ambulatory Visit (INDEPENDENT_AMBULATORY_CARE_PROVIDER_SITE_OTHER): Payer: Self-pay | Admitting: Family Medicine

## 2020-03-03 DIAGNOSIS — E038 Other specified hypothyroidism: Secondary | ICD-10-CM

## 2020-03-03 MED ORDER — THYROID 90 MG PO TABS: ORAL_TABLET | ORAL | 0 refills | Status: DC

## 2020-03-03 NOTE — Telephone Encounter (Signed)
Pt asking for a refill, appt not until 03/10/20. Pt states that she will run out before next appt

## 2020-03-10 ENCOUNTER — Ambulatory Visit (INDEPENDENT_AMBULATORY_CARE_PROVIDER_SITE_OTHER): Payer: Federal, State, Local not specified - PPO | Admitting: Family Medicine

## 2020-03-10 ENCOUNTER — Other Ambulatory Visit: Payer: Self-pay

## 2020-03-10 ENCOUNTER — Encounter (INDEPENDENT_AMBULATORY_CARE_PROVIDER_SITE_OTHER): Payer: Self-pay | Admitting: Family Medicine

## 2020-03-10 VITALS — BP 111/70 | HR 72 | Temp 98.3°F | Ht 65.0 in | Wt 201.0 lb

## 2020-03-10 DIAGNOSIS — Z9189 Other specified personal risk factors, not elsewhere classified: Secondary | ICD-10-CM | POA: Insufficient documentation

## 2020-03-10 DIAGNOSIS — E669 Obesity, unspecified: Secondary | ICD-10-CM

## 2020-03-10 DIAGNOSIS — E559 Vitamin D deficiency, unspecified: Secondary | ICD-10-CM

## 2020-03-10 DIAGNOSIS — G479 Sleep disorder, unspecified: Secondary | ICD-10-CM | POA: Diagnosis not present

## 2020-03-10 DIAGNOSIS — Z6833 Body mass index (BMI) 33.0-33.9, adult: Secondary | ICD-10-CM

## 2020-03-10 MED ORDER — VITAMIN D (ERGOCALCIFEROL) 1.25 MG (50000 UNIT) PO CAPS: ORAL_CAPSULE | ORAL | 0 refills | Status: DC

## 2020-03-12 NOTE — Progress Notes (Signed)
Chief Complaint:   OBESITY Destiny [name] is here to discuss her progress with her obesity treatment plan along with follow-up of her obesity related diagnoses. Destiny [name] is on the Category 2 Plan and states she is following her eating plan approximately 70% of the time. Destiny [name] states she is walking and pilates 50 minutes 2-4 times per week.  Today's visit was #: 11 Starting weight: 229 lbs Starting date: 10/04/2019 Today's weight: 201 lbs Today's date: 03/10/2020 Total lbs lost to date: 28 lbs Total lbs lost since last in-office visit: +4 lbs Total weight loss percentage to date: -12.23%  Interim History: Destiny [name] reports that she hasn't been sleeping for the past 3 weeks due to her 49 year old not sleeping. They are working with her pediatrician to rectify sleep issue.  - With less sleep, and with [place] being under increased stress, she is making poorer food decisions and doing a lot more snacking.  Destiny [name] reports no issues with the plan, and says she just needs sleep and to have her child sleep.    Assessment/Plan:   Meds ordered this encounter  Medications  . Vitamin D, Ergocalciferol, (DRISDOL) 1.25 MG (50000 UNIT) CAPS capsule    Sig: Take one tablet q 10 days    Dispense:  3 capsule    Refill:  0   1. Vitamin D deficiency Destiny [name]'s Vitamin D level was 78.2 on 01/09/2020. She is currently taking prescription vitamin D 50,000 IU every 10 days. She denies nausea, vomiting or muscle weakness.  Ref. Range 01/09/2020 10:01  Vitamin D, 25-Hydroxy Latest Ref Range: 30.0 - 100.0 ng/mL 78.2   Plan: Refill Vit D for 1 month, as per below.  - Reiterated importance of vitamin D (as well as calcium) to their health and wellbeing.  - Reminded Destiny [name] that weight loss will likely improve availability of vitamin D, thus encouraged her to continue with meal plan and their weight loss efforts to further improve this condition. - I recommend patient continue to take weekly prescription vit D  50,000 IU - Informed patient this may be a lifelong thing, and she was encouraged to continue to take the medicine until told otherwise.   - we will need to monitor levels regularly (every 3-4 mo on average) to keep levels within normal limits.  - weight loss will likely improve availability of vitamin D, thus encouraged Destiny [name] to continue with meal plan and their weight loss efforts to further improve this condition - pt's questions and concerns regarding this condition addressed.  Refill- Vitamin D, Ergocalciferol, (DRISDOL) 1.25 MG (50000 UNIT) CAPS capsule; Take one tablet q 10 days  Dispense: 3 capsule; Refill: 0   2. Sleeping difficulty Destiny [name] is not sleeping well due to her child not sleeping and it is causing increased emotional eating behaviors.   Plan: We discussed some techniques for stress management and sleep hygiene for Destiny [name]. However, she is working hard to get her child's sleep regulated. Destiny [name] declines medication at this time as she feels she needs to be alert and fully aware for her child's sake.    3. At risk for depression Destiny [name] was given approximately 9 minutes of depression risk counseling today. She has risk factors for depression for emotional distress due to not sleeping. We discussed the importance of a healthy work life balance, a healthy relationship with food and a good support system.   4. Class 1 obesity with serious comorbidity and body mass index (BMI) of 33.0 to 33.9  in adult, unspecified obesity type Destiny [name] is currently in the action stage of change. As such, her goal is to continue with weight loss efforts. She has agreed to the Category 2 Plan.   Exercise goals: As is  Behavioral modification strategies: meal planning and cooking strategies, keeping healthy foods in the home and planning for success.  Destiny [name] has agreed to follow-up with our clinic in 2 weeks. She was informed of the importance of frequent follow-up visits to maximize her success with  intensive lifestyle modifications for her multiple health conditions.   Objective:   Blood pressure 111/70, pulse 72, temperature 98.3 F (36.8 C), height 5\' 5"  (1.651 m), weight 201 lb (91.2 kg), SpO2 98 %. Body mass index is 33.45 kg/m.  General: Cooperative, alert, well developed, in no acute distress. HEENT: Conjunctivae and lids unremarkable. Cardiovascular: Regular rhythm.  Lungs: Normal work of breathing. Neurologic: No focal deficits.   Lab Results  Component Value Date   CREATININE 0.71 10/04/2019   BUN 14 10/04/2019   NA 140 10/04/2019   K 4.7 10/04/2019   CL 101 10/04/2019   CO2 26 10/04/2019   Lab Results  Component Value Date   ALT 12 10/04/2019   AST 15 10/04/2019   ALKPHOS 67 10/04/2019   BILITOT 0.5 10/04/2019   Lab Results  Component Value Date   HGBA1C 5.6 01/09/2020   HGBA1C 5.6 10/04/2019   Lab Results  Component Value Date   INSULIN 10.7 01/09/2020   INSULIN 8.4 10/04/2019   Lab Results  Component Value Date   TSH 0.379 (L) 01/09/2020   Lab Results  Component Value Date   CHOL 180 01/09/2020   HDL 50 01/09/2020   LDLCALC 118 (H) 01/09/2020   TRIG 62 01/09/2020   CHOLHDL 3.6 10/04/2019   Lab Results  Component Value Date   WBC 7.1 01/09/2020   HGB 14.2 01/09/2020   HCT 43.1 01/09/2020   MCV 90 01/09/2020   PLT 310 01/09/2020   Lab Results  Component Value Date   IRON 109 10/04/2019   TIBC 355 10/04/2019   FERRITIN 74 10/04/2019   Attestation Statements:   Reviewed by clinician on day of visit: allergies, medications, problem list, medical history, surgical history, family history, social history, and previous encounter notes.  12/04/2019, am acting as Edmund Hilda for Energy manager, DO.  I have reviewed the above documentation for accuracy and completeness, and I agree with the above. [employer], D.O.  The 21st Century Cures Act was signed into law in 2016 which includes the topic of electronic  health records.  This provides immediate access to information in MyChart.  This includes consultation notes, operative notes, office notes, lab results and pathology reports.  If you have any questions about what you read please let 2017 know at your next visit so we can discuss your concerns and take corrective action if need be.  We are right here with you.

## 2020-03-14 DIAGNOSIS — Z01419 Encounter for gynecological examination (general) (routine) without abnormal findings: Secondary | ICD-10-CM | POA: Diagnosis not present

## 2020-03-24 ENCOUNTER — Encounter (INDEPENDENT_AMBULATORY_CARE_PROVIDER_SITE_OTHER): Payer: Self-pay | Admitting: Family Medicine

## 2020-03-24 ENCOUNTER — Ambulatory Visit (INDEPENDENT_AMBULATORY_CARE_PROVIDER_SITE_OTHER): Payer: Federal, State, Local not specified - PPO | Admitting: Family Medicine

## 2020-03-24 ENCOUNTER — Other Ambulatory Visit: Payer: Self-pay

## 2020-03-24 VITALS — BP 119/72 | HR 65 | Temp 97.8°F | Ht 65.0 in | Wt 200.0 lb

## 2020-03-24 DIAGNOSIS — E038 Other specified hypothyroidism: Secondary | ICD-10-CM | POA: Diagnosis not present

## 2020-03-24 DIAGNOSIS — F39 Unspecified mood [affective] disorder: Secondary | ICD-10-CM | POA: Diagnosis not present

## 2020-03-24 DIAGNOSIS — Z9189 Other specified personal risk factors, not elsewhere classified: Secondary | ICD-10-CM | POA: Diagnosis not present

## 2020-03-24 DIAGNOSIS — E669 Obesity, unspecified: Secondary | ICD-10-CM | POA: Diagnosis not present

## 2020-03-24 DIAGNOSIS — E559 Vitamin D deficiency, unspecified: Secondary | ICD-10-CM

## 2020-03-24 DIAGNOSIS — Z6833 Body mass index (BMI) 33.0-33.9, adult: Secondary | ICD-10-CM

## 2020-03-24 MED ORDER — BUPROPION HCL ER (XL) 150 MG PO TB24
150.0000 mg | ORAL_TABLET | ORAL | 0 refills | Status: DC
Start: 2020-03-24 — End: 2020-04-21

## 2020-03-24 MED ORDER — THYROID 90 MG PO TABS: ORAL_TABLET | ORAL | 0 refills | Status: DC

## 2020-03-25 NOTE — Progress Notes (Signed)
Chief Complaint:   OBESITY Destiny [name] is here to discuss her progress with her obesity treatment plan along with follow-up of her obesity related diagnoses. Destiny [name] is on the Category 2 Plan and states she is following her eating plan approximately 75% of the time. Destiny [name] states she is walking, treadmill, and pilates 50 minutes 4 times per week.  Today's visit was #: 12 Starting weight: 229 lbs Starting date: 10/04/2019 Today's weight: 200 lbs Today's date: 03/24/2020 Total lbs lost to date: 29 lbs Total lbs lost since last in-office visit: 1 lb Total weight loss percentage to date: -12.66%  Interim History: Destiny [name] reports things are going well but it has been difficult over the snow days. She has been feeling a little down over winter and doing more emotional eating.   Plan: We discussed food options and ways to create meals she likes with using snack calories to enhance foods (I.e. adding sauce and cheese, etc.)  Assessment/Plan:   Meds ordered this encounter  Medications  . DISCONTD: thyroid (ARMOUR THYROID) 90 MG tablet    Sig: TAKE 1 EVERY DAY    Dispense:  30 tablet    Refill:  0  . buPROPion (WELLBUTRIN XL) 150 MG 24 hr tablet    Sig: Take 1 tablet (150 mg total) by mouth every morning.    Dispense:  90 tablet    Refill:  0  . thyroid (ARMOUR THYROID) 90 MG tablet    Sig: TAKE 1 EVERY DAY    Dispense:  30 tablet    Refill:  0    1. Other specified hypothyroidism Destiny [name] is on Amour Thyroid and tolerating it well. She has no symptoms or concerns.  Lab Results  Component Value Date   TSH 0.379 (L) 01/09/2020   Plan: Refill Armour Thyroid for 1 month, as per below. Patient with Destiny [name]-standing hypothyroidism, on levothyroxine therapy. She appears euthyroid. Orders and follow up as documented in patient record.  Counseling . Good thyroid control is important for overall health. Supratherapeutic thyroid levels are dangerous and will not improve weight loss results. . The correct  way to take levothyroxine is fasting, with water, separated by at least 30 minutes from breakfast, and separated by more than 4 hours from calcium, iron, multivitamins, acid reflux medications (PPIs).   Refill- thyroid (ARMOUR THYROID) 90 MG tablet; TAKE 1 EVERY DAY  Dispense: 30 tablet; Refill: 0  2. Vitamin D deficiency Lodema's Vitamin D level was 78.2 on 01/09/2020. She is currently taking prescription vitamin D 50,000 IU each week. She denies nausea, vomiting or muscle weakness.  Ref. Range 01/09/2020 10:01  Vitamin D, 25-Hydroxy Latest Ref Range: 30.0 - 100.0 ng/mL 78.2   Plan: Continue current treatment plan. Low Vitamin D level contributes to fatigue and are associated with obesity, breast, and colon cancer. She agrees to continue to take prescription Vitamin D @50 ,000 IU every week and will follow-up for routine testing of Vitamin D, at least 2-3 times per year to avoid over-replacement.  3. Mood disorder (HCC), with emotional eating Destiny [name] is feeling overwhelmed with life and all responsibilities with special needs kids, etc. She denies suicidal or homicidal ideations. She is doing more emotional eating than usual in the afternoon and evenings.  Plan: Start Wellbutrin XL 150 mg, as per below, after discussing risks and benefits of medication. Start moving more, exercise, yoga, or meditation for herself everyday.  Start- buPROPion (WELLBUTRIN XL) 150 MG 24 hr tablet; Take 1 tablet (150 mg total) by  mouth every morning.  Dispense: 90 tablet; Refill: 0  4. At risk for depression Jaicey was given approximately 9 minutes of depression risk counseling today. She has risk factors for depression. We discussed the importance of a healthy work life balance, a healthy relationship with food and a good support system.  5. Class 1 obesity with serious comorbidity and body mass index (BMI) of 33.0 to 33.9 in adult, unspecified obesity type Destiny [name] is currently in the action stage of change. As such, her  goal is to continue with weight loss efforts. She has agreed to the Category 2 Plan.   Exercise goals: For substantial health benefits, adults should do at least 150 minutes (2 hours and 30 minutes) a week of moderate-intensity, or 75 minutes (1 hour and 15 minutes) a week of vigorous-intensity aerobic physical activity, or an equivalent combination of moderate- and vigorous-intensity aerobic activity. Aerobic activity should be performed in episodes of at least 10 minutes, and preferably, it should be spread throughout the week. As is  Behavioral modification strategies: increasing lean protein intake, meal planning and cooking strategies, keeping healthy foods in the home, better snacking choices, emotional eating strategies, avoiding temptations and planning for success.  Destiny [name] has agreed to follow-up with our clinic in 2 weeks, after starting Wellbutrin today. She was informed of the importance of frequent follow-up visits to maximize her success with intensive lifestyle modifications for her multiple health conditions.   Objective:   Blood pressure 119/72, pulse 65, temperature 97.8 F (36.6 C), height 5\' 5"  (1.651 m), weight 200 lb (90.7 kg), SpO2 99 %. Body mass index is 33.28 kg/m.  General: Cooperative, alert, well developed, in no acute distress. HEENT: Conjunctivae and lids unremarkable. Cardiovascular: Regular rhythm.  Lungs: Normal work of breathing. Neurologic: No focal deficits.   Lab Results  Component Value Date   CREATININE 0.71 10/04/2019   BUN 14 10/04/2019   NA 140 10/04/2019   K 4.7 10/04/2019   CL 101 10/04/2019   CO2 26 10/04/2019   Lab Results  Component Value Date   ALT 12 10/04/2019   AST 15 10/04/2019   ALKPHOS 67 10/04/2019   BILITOT 0.5 10/04/2019   Lab Results  Component Value Date   HGBA1C 5.6 01/09/2020   HGBA1C 5.6 10/04/2019   Lab Results  Component Value Date   INSULIN 10.7 01/09/2020   INSULIN 8.4 10/04/2019   Lab Results   Component Value Date   TSH 0.379 (L) 01/09/2020   Lab Results  Component Value Date   CHOL 180 01/09/2020   HDL 50 01/09/2020   LDLCALC 118 (H) 01/09/2020   TRIG 62 01/09/2020   CHOLHDL 3.6 10/04/2019   Lab Results  Component Value Date   WBC 7.1 01/09/2020   HGB 14.2 01/09/2020   HCT 43.1 01/09/2020   MCV 90 01/09/2020   PLT 310 01/09/2020   Lab Results  Component Value Date   IRON 109 10/04/2019   TIBC 355 10/04/2019   FERRITIN 74 10/04/2019    Attestation Statements:   Reviewed by clinician on day of visit: allergies, medications, problem list, medical history, surgical history, family history, social history, and previous encounter notes.  12/04/2019, am acting as Edmund Hilda for Energy manager, DO.  I have reviewed the above documentation for accuracy and completeness, and I agree with the above. [employer], D.O.  The 21st Century Cures Act was signed into law in 2016 which includes the topic of electronic health records.  This provides immediate access to information in MyChart.  This includes consultation notes, operative notes, office notes, lab results and pathology reports.  If you have any questions about what you read please let us know at your next visit so we can discuss your concerns and take corrective action if need be.  We are right here with you.

## 2020-04-03 ENCOUNTER — Other Ambulatory Visit: Payer: Self-pay

## 2020-04-03 ENCOUNTER — Ambulatory Visit: Payer: Federal, State, Local not specified - PPO | Admitting: Family Medicine

## 2020-04-03 ENCOUNTER — Encounter: Payer: Self-pay | Admitting: Family Medicine

## 2020-04-03 VITALS — BP 128/87 | HR 76 | Temp 97.7°F | Ht 65.0 in | Wt 200.0 lb

## 2020-04-03 DIAGNOSIS — E038 Other specified hypothyroidism: Secondary | ICD-10-CM

## 2020-04-03 DIAGNOSIS — E785 Hyperlipidemia, unspecified: Secondary | ICD-10-CM | POA: Insufficient documentation

## 2020-04-03 DIAGNOSIS — F39 Unspecified mood [affective] disorder: Secondary | ICD-10-CM | POA: Diagnosis not present

## 2020-04-03 MED ORDER — LEVOTHYROXINE SODIUM 150 MCG PO TABS: 150.0000 ug | ORAL_TABLET | Freq: Every day | ORAL | 3 refills | Status: DC

## 2020-04-03 NOTE — Assessment & Plan Note (Signed)
She started Wellbutrin.  Has been tolerating well for the last week.

## 2020-04-03 NOTE — Assessment & Plan Note (Signed)
Had a lengthy discussion with patient regarding management of her hypothyroidism.  Based on her given history sounds like she initially had subclinical hypothyroidism that may have progressed to full-blown hyperthyroidism during her pregnancy.  Her last several TSH values are too low.  Per patient report she has been in this range for last couple of years.  We will switch from Armour Thyroid 90 mg daily to Synthroid 150 mcg daily to allow for more consistent dosing.  Doubt that she will have significant effects with this change but discussed possible side effects.  She will follow-up in 6 weeks to recheck TSH, T3, and T4.  If we continue to have issues with maintaining normal TSH will need referral to endocrinology.

## 2020-04-03 NOTE — Patient Instructions (Signed)
It was very nice to see you today!  We will switch from Armour Thyroid to Synthroid.  This will make it easier to adjust the dose of thyroid that you are taking.  Please let me know if you notice any changes with the change in medication.  Please come back in 6 weeks to recheck your thyroid numbers.   Take care, Dr Jimmey Ralph  Please try these tips to maintain a healthy lifestyle:   Eat at least 3 REAL meals and 1-2 snacks per day.  Aim for no more than 5 hours between eating.  If you eat breakfast, please do so within one hour of getting up.    Each meal should contain half fruits/vegetables, one quarter protein, and one quarter carbs (no bigger than a computer mouse)   Cut down on sweet beverages. This includes juice, soda, and sweet tea.     Drink at least 1 glass of water with each meal and aim for at least 8 glasses per day   Exercise at least 150 minutes every week.

## 2020-04-03 NOTE — Progress Notes (Signed)
   Destiny [name] is a 50 y.o. female who presents today for an office visit.  She is a new patient.   Assessment/Plan:  Chronic Problems Addressed Today: Dyslipidemia Continue lifestyle modifications.  Mood disorder (HCC), with emotional eating She started Wellbutrin.  Has been tolerating well for the last week.  Other specified hypothyroidism Had a lengthy discussion with patient regarding management of her hypothyroidism.  Based on her given history sounds like she initially had subclinical hypothyroidism that may have progressed to full-blown hyperthyroidism during her pregnancy.  Her last several TSH values are too low.  Per patient report she has been in this range for last couple of years.  We will switch from Armour Thyroid 90 mg daily to Synthroid 150 mcg daily to allow for more consistent dosing.  Doubt that she will have significant effects with this change but discussed possible side effects.  She will follow-up in 6 weeks to recheck TSH, T3, and T4.  If we continue to have issues with maintaining normal TSH will need referral to endocrinology.      Subjective:  HPI:  She is here as a new patient to establish care.  Her primary concern today is hypothyroidism.  She was diagnosed 3 years ago just prior to becoming pregnant.  Reportedly had normal TSH but low T3. She went to [facility].  She was started on Armour Thyroid 60 mg daily.  This was gradually increased to 120 mg daily.  She did well on this dose.  She recently followed up with healthy weight management.  Was found to have extremely low TSH.  Her Armour Thyroid was back down to 90 mg daily.  There was concern about low free T3 and low TSH and she was recommended to find a PCP to manage her thyroid medications.        Objective:  Physical Exam: BP 128/87   Pulse 76   Temp 97.7 F (36.5 C) (Temporal)   Ht 5\' 5"  (1.651 m)   Wt 200 lb (90.7 kg)   LMP 03/14/2020   SpO2 100%   BMI 33.28 kg/m   Gen:  No acute distress, resting comfortably CV: Regular rate and rhythm with no murmurs appreciated Pulm: Normal work of breathing, clear to auscultation bilaterally with no crackles, wheezes, or rhonchi Neuro: Grossly normal, moves all extremities Psych: Normal affect and thought content      Caleb M. 03/16/2020, MD 04/03/2020 10:35 AM

## 2020-04-03 NOTE — Assessment & Plan Note (Signed)
Continue lifestyle modifications. 

## 2020-04-07 ENCOUNTER — Other Ambulatory Visit: Payer: Self-pay

## 2020-04-07 ENCOUNTER — Encounter (INDEPENDENT_AMBULATORY_CARE_PROVIDER_SITE_OTHER): Payer: Self-pay | Admitting: Family Medicine

## 2020-04-07 ENCOUNTER — Telehealth (INDEPENDENT_AMBULATORY_CARE_PROVIDER_SITE_OTHER): Payer: Federal, State, Local not specified - PPO | Admitting: Family Medicine

## 2020-04-07 DIAGNOSIS — D508 Other iron deficiency anemias: Secondary | ICD-10-CM

## 2020-04-07 DIAGNOSIS — D649 Anemia, unspecified: Secondary | ICD-10-CM | POA: Insufficient documentation

## 2020-04-07 DIAGNOSIS — Z9189 Other specified personal risk factors, not elsewhere classified: Secondary | ICD-10-CM

## 2020-04-07 DIAGNOSIS — E038 Other specified hypothyroidism: Secondary | ICD-10-CM | POA: Diagnosis not present

## 2020-04-07 DIAGNOSIS — F39 Unspecified mood [affective] disorder: Secondary | ICD-10-CM

## 2020-04-07 DIAGNOSIS — Z6833 Body mass index (BMI) 33.0-33.9, adult: Secondary | ICD-10-CM

## 2020-04-07 DIAGNOSIS — E559 Vitamin D deficiency, unspecified: Secondary | ICD-10-CM

## 2020-04-07 DIAGNOSIS — E669 Obesity, unspecified: Secondary | ICD-10-CM

## 2020-04-07 MED ORDER — VITAMIN D (ERGOCALCIFEROL) 1.25 MG (50000 UNIT) PO CAPS: ORAL_CAPSULE | ORAL | 0 refills | Status: DC

## 2020-04-09 NOTE — Progress Notes (Signed)
TeleHealth Visit:  Due to the COVID-19 pandemic, this visit was completed with telemedicine (audio/video) technology to reduce patient and provider exposure as well as to preserve personal protective equipment.   Destiny [name] has verbally consented to this TeleHealth visit. The patient is located at home, the provider is located at the [facility] office. The participants in this visit include the listed provider and patient and. The visit was conducted today via MyChart.  OBESITY Destiny [name] is here to discuss her progress with her obesity treatment plan along with follow-up of her obesity related diagnoses.   Today's visit was #: 13 Starting weight: 229 lbs Starting date: 10/04/2019 Today's date: 04/07/2020  Interim History: Destiny [name] says she has had increased family stressors lately.  Destiny [name] is here for a follow up office visit.  she is following the meal plan without concern or issues.  Patient's meal and food recall appears to be accurate and consistent with what is on the plan.  When on plan, her hunger and cravings are well controlled.    Nutrition Plan: Category 2 Plan for 75-80% of the time. Activity: Treadmill / Pilates for 30-50 minutes 2-4 times per week. Stress: Elevated.  Assessment/Plan:    Medications Discontinued During This Encounter  Medication Reason  . Vitamin D, Ergocalciferol, (DRISDOL) 1.25 MG (50000 UNIT) CAPS capsule Reorder     Meds ordered this encounter  Medications  . Vitamin D, Ergocalciferol, (DRISDOL) 1.25 MG (50000 UNIT) CAPS capsule    Sig: Take one tablet q 10 days    Dispense:  3 capsule    Refill:  0     1. Other specified hypothyroidism Medication: Synthroid 150 mcg daily.  She recently had an appointment with her PCP, and was changed from Armour Thyroid to Synthroid.  Her PCP will recheck her labs in 6 weeks.  Plan:  Stable on current plan by PCP.  Lab Results  Component Value Date   TSH 0.379 (L) 01/09/2020   2. Other  iron deficiency anemia Destiny [name] is taking ferrous sulfate 325 mg daily.  Tolerating well, minimal constipation.  Denies anything bothersome.  Plan:  Labs stable in November 2021.  She should drink half her weight in water in ounces per day and take MiraLAX as needed.  3. Vitamin D deficiency At goal. Current vitamin D is 78.2, tested on 01/09/2020. Optimal goal > 50 ng/dL. Plan: Continue to take prescription Vitamin D @50 ,000 IU every week as prescribed.  Follow-up for routine testing of Vitamin D, at least 2-3 times per year to avoid over-replacement.  - Refill Vitamin D, Ergocalciferol, (DRISDOL) 1.25 MG (50000 UNIT) CAPS capsule; Take one tablet q 10 days  Dispense: 3 capsule; Refill: 0  4. Mood disorder (HCC), with emotional eating Controlled. Medication: Wellbutrin XL 150 mg daily.  Started Wellbutrin at last office visit.  Tolerating well.  Denies issues.  Not sure if it helps yet.  Mood has been stable despite increased stressors lately.  No side effects or concerns.  Plan:  Continue Wellbutrin and self-care activities.  Encouraged increasing exercise.    5. Class 1 obesity with serious comorbidity and body mass index (BMI) of 33.0 to 33.9 in adult, unspecified obesity type  Course: Destiny [name] is currently in the action stage of change. As such, her goal is to continue with weight loss efforts.   Nutrition goals: She has agreed to the Category 2 Plan.   Exercise goals: For substantial health benefits, adults should do at least  150 minutes (2 hours and 30 minutes) a week of moderate-intensity, or 75 minutes (1 hour and 15 minutes) a week of vigorous-intensity aerobic physical activity, or an equivalent combination of moderate- and vigorous-intensity aerobic activity. Aerobic activity should be performed in episodes of at least 10 minutes, and preferably, it should be spread throughout the week.  Behavioral modification strategies: better snacking choices, emotional eating strategies and  planning for success.  Destiny [name] has agreed to follow-up with our clinic in 2 weeks. She was informed of the importance of frequent follow-up visits to maximize her success with intensive lifestyle modifications for her multiple health conditions.   Objective:   Last menstrual period 03/14/2020. There is no height or weight on file to calculate BMI.  General: Cooperative, alert, well developed, in no acute distress. HEENT: Conjunctivae and lids unremarkable. Cardiovascular: Regular rhythm.  Lungs: Normal work of breathing. Neurologic: No focal deficits.   Lab Results  Component Value Date   CREATININE 0.71 10/04/2019   BUN 14 10/04/2019   NA 140 10/04/2019   K 4.7 10/04/2019   CL 101 10/04/2019   CO2 26 10/04/2019   Lab Results  Component Value Date   ALT 12 10/04/2019   AST 15 10/04/2019   ALKPHOS 67 10/04/2019   BILITOT 0.5 10/04/2019   Lab Results  Component Value Date   HGBA1C 5.6 01/09/2020   HGBA1C 5.6 10/04/2019   Lab Results  Component Value Date   INSULIN 10.7 01/09/2020   INSULIN 8.4 10/04/2019   Lab Results  Component Value Date   TSH 0.379 (L) 01/09/2020   Lab Results  Component Value Date   CHOL 180 01/09/2020   HDL 50 01/09/2020   LDLCALC 118 (H) 01/09/2020   TRIG 62 01/09/2020   CHOLHDL 3.6 10/04/2019   Lab Results  Component Value Date   WBC 7.1 01/09/2020   HGB 14.2 01/09/2020   HCT 43.1 01/09/2020   MCV 90 01/09/2020   PLT 310 01/09/2020   Lab Results  Component Value Date   IRON 109 10/04/2019   TIBC 355 10/04/2019   FERRITIN 74 10/04/2019   Attestation Statements:   Reviewed by clinician on day of visit: allergies, medications, problem list, medical history, surgical history, family history, social history, and previous encounter notes.  I, Insurance claims handler, CMA, am acting as Energy manager for [employer], DO.  I have reviewed the above documentation for accuracy and completeness, and I agree with the above. Carlye Grippe, D.O.  The 21st Century Cures Act was signed into law in 2016 which includes the topic of electronic health records.  This provides immediate access to information in MyChart.  This includes consultation notes, operative notes, office notes, lab results and pathology reports.  If you have any questions about what you read please let us know at your next visit so we can discuss your concerns and take corrective action if need be.  We are right here with you.

## 2020-04-21 ENCOUNTER — Other Ambulatory Visit: Payer: Self-pay

## 2020-04-21 ENCOUNTER — Encounter (INDEPENDENT_AMBULATORY_CARE_PROVIDER_SITE_OTHER): Payer: Self-pay | Admitting: Family Medicine

## 2020-04-21 ENCOUNTER — Ambulatory Visit (INDEPENDENT_AMBULATORY_CARE_PROVIDER_SITE_OTHER): Payer: Federal, State, Local not specified - PPO | Admitting: Family Medicine

## 2020-04-21 VITALS — BP 122/74 | HR 75 | Temp 97.9°F | Ht 65.0 in | Wt 198.0 lb

## 2020-04-21 DIAGNOSIS — E669 Obesity, unspecified: Secondary | ICD-10-CM

## 2020-04-21 DIAGNOSIS — F39 Unspecified mood [affective] disorder: Secondary | ICD-10-CM

## 2020-04-21 DIAGNOSIS — E559 Vitamin D deficiency, unspecified: Secondary | ICD-10-CM

## 2020-04-21 DIAGNOSIS — Z6833 Body mass index (BMI) 33.0-33.9, adult: Secondary | ICD-10-CM

## 2020-04-21 DIAGNOSIS — E8881 Metabolic syndrome: Secondary | ICD-10-CM

## 2020-04-21 DIAGNOSIS — Z9189 Other specified personal risk factors, not elsewhere classified: Secondary | ICD-10-CM

## 2020-04-21 MED ORDER — BUPROPION HCL ER (XL) 150 MG PO TB24: 150.0000 mg | ORAL_TABLET | ORAL | 0 refills | Status: DC

## 2020-04-21 MED ORDER — VITAMIN D (ERGOCALCIFEROL) 1.25 MG (50000 UNIT) PO CAPS: ORAL_CAPSULE | ORAL | 0 refills | Status: DC

## 2020-04-26 DIAGNOSIS — Z9189 Other specified personal risk factors, not elsewhere classified: Secondary | ICD-10-CM | POA: Insufficient documentation

## 2020-04-26 MED ORDER — BUPROPION HCL ER (XL) 150 MG PO TB24: 150.0000 mg | ORAL_TABLET | ORAL | 0 refills | Status: DC

## 2020-04-26 MED ORDER — VITAMIN D (ERGOCALCIFEROL) 1.25 MG (50000 UNIT) PO CAPS: ORAL_CAPSULE | ORAL | 0 refills | Status: DC

## 2020-04-29 NOTE — Progress Notes (Signed)
Chief Complaint:   OBESITY Destiny [name] is here to discuss her progress with her obesity treatment plan along with follow-up of her obesity related diagnoses.   Today's visit was #: 14 Starting weight: 229 lbs Starting date: 10/04/2019 Today's weight: 198 lbs Today's date: 04/21/2020 Total lbs lost to date: 31 lbs Body mass index is 32.95 kg/m.  Total weight loss percentage to date: -13.54%  Interim History:  Iara says that her first week was on track and then during her second week her entire family had an illness (URI symptoms with COVID).  She ate more carbs during that week with a slight increase in hunger as a result of eating more off plan foods.  Current Meal Plan: the Category 2 Plan for 50% of the time.  Current Exercise Plan: Pilates/walking for 50 minutes 3-4 times per week.  Assessment/Plan:    Medications Discontinued During This Encounter  Medication Reason  . buPROPion (WELLBUTRIN XL) 150 MG 24 hr tablet Reorder  . Vitamin D, Ergocalciferol, (DRISDOL) 1.25 MG (50000 UNIT) CAPS capsule Reorder  . buPROPion (WELLBUTRIN XL) 150 MG 24 hr tablet   . Vitamin D, Ergocalciferol, (DRISDOL) 1.25 MG (50000 UNIT) CAPS capsule Entry Error     Meds ordered this encounter  Medications  . DISCONTD: buPROPion (WELLBUTRIN XL) 150 MG 24 hr tablet    Sig: Take 1 tablet (150 mg total) by mouth every morning.    Dispense:  90 tablet    Refill:  0  . DISCONTD: Vitamin D, Ergocalciferol, (DRISDOL) 1.25 MG (50000 UNIT) CAPS capsule    Sig: Take one tablet q 10 days    Dispense:  3 capsule    Refill:  0  . buPROPion (WELLBUTRIN XL) 150 MG 24 hr tablet    Sig: Take 1 tablet (150 mg total) by mouth every morning.    Dispense:  90 tablet    Refill:  0  . Vitamin D, Ergocalciferol, (DRISDOL) 1.25 MG (50000 UNIT) CAPS capsule    Sig: Take one tablet q 10 days    Dispense:  3 capsule    Refill:  0     1. Insulin resistance Not at goal. Goal is HgbA1c < 5.7, fasting insulin closer  to 5.  Medication: None.  A1c normal, fasting insulin elevated.  Plan:  She will continue to focus on protein-rich, low simple carbohydrate foods. We reviewed the importance of hydration, regular exercise for stress reduction, and restorative sleep.  Educated her on the effects of simple .carbs and insulin levels along with hunger.  Handouts given.  Lab Results  Component Value Date   HGBA1C 5.6 01/09/2020   Lab Results  Component Value Date   INSULIN 10.7 01/09/2020   INSULIN 8.4 10/04/2019   2. Vitamin D deficiency Not at goal. Current vitamin D is 78.2, tested on 01/09/2020. Optimal goal > 50 ng/dL.  She is taking vitamin D 50,000 IU weekly.   Plan: Continue to take prescription Vitamin D @50 ,000 IU every week as prescribed.  Follow-up for routine testing of Vitamin D, at least 2-3 times per year to avoid over-replacement.  - Refill Vitamin D, Ergocalciferol, (DRISDOL) 1.25 MG (50000 UNIT) CAPS capsule; Take one tablet q 10 days  Dispense: 3 capsule; Refill: 0  3. Mood disorder (HCC), with emotional eating Improving, but not optimized. Medication: Wellbutrin XL 150 mg daily.  She has been on Wellbutrin for 4 weeks now.  No side effects and tolerating well, but not sure it is doing  much yet.  Plan:  Continue Wellbutrin 150 mg daily (denies need for refill).  Increase movement and stress management techniques encouraged daily.  Behavior modification techniques were discussed today to help deal with emotional/non-hunger eating behaviors.  - Refill buPROPion (WELLBUTRIN XL) 150 MG 24 hr tablet; Take 1 tablet (150 mg total) by mouth every morning.  Dispense: 90 tablet; Refill: 0  4. At risk for diabetes mellitus - Undine was given diabetes prevention education and counseling today of more than 8 minutes.  - Counseled patient on pathophysiology of disease and meaning/ implication of lab results.  - Reviewed how certain foods can either stimulate or inhibit insulin release, and subsequently  affect hunger pathways  - Importance of following a healthy meal plan with limiting amounts of simple carbohydrates discussed with patient - Effects of regular aerobic exercise on blood sugar regulation reviewed and encouraged an eventual goal of 30 min 5d/week or more as a minimum.  - Briefly discussed treatment options, which always include dietary and lifestyle modification as first line.   - Handouts provided at patient's desire and/or told to go online to the American Diabetes Association website for further information.  5. Class 1 obesity with serious comorbidity and body mass index (BMI) of 33.0 to 33.9 in adult, unspecified obesity type  Course: Passion is currently in the action stage of change. As such, her goal is to continue with weight loss efforts.   Nutrition goals: She has agreed to the Category 2 Plan.   Exercise goals: As is.  Behavioral modification strategies: increasing lean protein intake, decreasing simple carbohydrates, meal planning and cooking strategies and avoiding temptations.  Imogean has agreed to follow-up with our clinic in 2 weeks. She was informed of the importance of frequent follow-up visits to maximize her success with intensive lifestyle modifications for her multiple health conditions.   Objective:   Blood pressure 122/74, pulse 75, temperature 97.9 F (36.6 C), height 5\' 5"  (1.651 m), weight 198 lb (89.8 kg), SpO2 96 %. Body mass index is 32.95 kg/m.  General: Cooperative, alert, well developed, in no acute distress. HEENT: Conjunctivae and lids unremarkable. Cardiovascular: Regular rhythm.  Lungs: Normal work of breathing. Neurologic: No focal deficits.   Lab Results  Component Value Date   CREATININE 0.71 10/04/2019   BUN 14 10/04/2019   NA 140 10/04/2019   K 4.7 10/04/2019   CL 101 10/04/2019   CO2 26 10/04/2019   Lab Results  Component Value Date   ALT 12 10/04/2019   AST 15 10/04/2019   ALKPHOS 67 10/04/2019   BILITOT 0.5  10/04/2019   Lab Results  Component Value Date   HGBA1C 5.6 01/09/2020   HGBA1C 5.6 10/04/2019   Lab Results  Component Value Date   INSULIN 10.7 01/09/2020   INSULIN 8.4 10/04/2019   Lab Results  Component Value Date   TSH 0.379 (L) 01/09/2020   Lab Results  Component Value Date   CHOL 180 01/09/2020   HDL 50 01/09/2020   LDLCALC 118 (H) 01/09/2020   TRIG 62 01/09/2020   CHOLHDL 3.6 10/04/2019   Lab Results  Component Value Date   WBC 7.1 01/09/2020   HGB 14.2 01/09/2020   HCT 43.1 01/09/2020   MCV 90 01/09/2020   PLT 310 01/09/2020   Lab Results  Component Value Date   IRON 109 10/04/2019   TIBC 355 10/04/2019   FERRITIN 74 10/04/2019   Attestation Statements:   Reviewed by clinician on day of visit: allergies, medications,  problem list, medical history, surgical history, family history, social history, and previous encounter notes.  I, Water quality scientist, CMA, am acting as Location manager for [employer], DO.  I have reviewed the above documentation for accuracy and completeness, and I agree with the above. Marjory Sneddon, D.O.  The [place] was signed into law in 2016 which includes the topic of electronic health records.  This provides immediate access to information in MyChart.  This includes consultation notes, operative notes, office notes, lab results and pathology reports.  If you have any questions about what you read please let us know at your next visit so we can discuss your concerns and take corrective action if need be.  We are right here with you.

## 2020-05-05 ENCOUNTER — Encounter (INDEPENDENT_AMBULATORY_CARE_PROVIDER_SITE_OTHER): Payer: Self-pay | Admitting: Family Medicine

## 2020-05-05 ENCOUNTER — Ambulatory Visit (INDEPENDENT_AMBULATORY_CARE_PROVIDER_SITE_OTHER): Payer: Federal, State, Local not specified - PPO | Admitting: Family Medicine

## 2020-05-05 ENCOUNTER — Other Ambulatory Visit: Payer: Self-pay

## 2020-05-05 VITALS — BP 112/70 | HR 70 | Temp 97.6°F | Ht 65.0 in | Wt 197.0 lb

## 2020-05-05 DIAGNOSIS — E669 Obesity, unspecified: Secondary | ICD-10-CM

## 2020-05-05 DIAGNOSIS — Z9189 Other specified personal risk factors, not elsewhere classified: Secondary | ICD-10-CM

## 2020-05-05 DIAGNOSIS — F39 Unspecified mood [affective] disorder: Secondary | ICD-10-CM | POA: Diagnosis not present

## 2020-05-05 DIAGNOSIS — E559 Vitamin D deficiency, unspecified: Secondary | ICD-10-CM | POA: Diagnosis not present

## 2020-05-05 DIAGNOSIS — Z6832 Body mass index (BMI) 32.0-32.9, adult: Secondary | ICD-10-CM | POA: Diagnosis not present

## 2020-05-05 DIAGNOSIS — E66811 Obesity, class 1: Secondary | ICD-10-CM

## 2020-05-06 DIAGNOSIS — H40013 Open angle with borderline findings, low risk, bilateral: Secondary | ICD-10-CM | POA: Diagnosis not present

## 2020-05-06 DIAGNOSIS — H52203 Unspecified astigmatism, bilateral: Secondary | ICD-10-CM | POA: Diagnosis not present

## 2020-05-07 NOTE — Progress Notes (Signed)
Chief Complaint:   OBESITY Destiny [name] is here to discuss her progress with her obesity treatment plan along with follow-up of her obesity related diagnoses.   Today's visit was #: 15 Starting weight: 229 lbs Starting date: 10/04/2019 Today's weight: 197 lbs Today's date: 05/05/2020 Total lbs lost to date: 32 lbs Body mass index is 32.78 kg/m.  Total weight loss percentage to date: -13.97%  Interim History:  Destiny [name] has had increased stressors in her life lately.  She gained 4.4 pounds in water weight over the past couple of weeks since her last office visit.  Denies issues with the plan.  Traveled over the weekend with family and did more "on the road" eating than usual.  Current Meal Plan: the Category 2 Plan for 80% of the time.  Current Exercise Plan: Walking for 45 minutes 2-3 times per week.  Assessment/Plan:   1. Vitamin D deficiency At goal. Current vitamin D is 78.2, tested on 01/09/2020. Optimal goal > 50 ng/dL.  She is taking vitamin D 50,000 IU every 10 days.  Plan: Continue to take prescription Vitamin D @50 ,000 IU every 10 days as prescribed.  Follow-up for routine testing of Vitamin D, at least 2-3 times per year to avoid over-replacement.  2. Mood disorder (HCC), with emotional eating Improving, but not optimized. Medication: Wellbutrin XL 150 mg daily.  Destiny [name] started Wellbutrin about 6 weeks ago.  Tolerating well without side effects or concerns.  She thinks it is helping some.  Plan:  Continue Wellbutrin at current dose.  Behavior modification techniques were discussed today to help deal with emotional/non-hunger eating behaviors.  3. At risk for malnutrition Destiny [name] was given extensive malnutrition prevention education and counseling today of more than 8 minutes.  Counseled her that malnutrition refers to inappropriate nutrients or not the right balance of nutrients for optimal health.  Discussed with Destiny [name] that it is absolutely possible to be malnourished  but yet obese.  Risk factors, including but not limited to, inappropriate dietary choices, difficulty with obtaining food due to physical or financial limitations, and various physical and mental health conditions were reviewed with Destiny [name].   4. Class 1 obesity with serious comorbidity and body mass index (BMI) of 32.0 to 32.9 in adult, unspecified obesity type  Course: Destiny [name] is currently in the action stage of change. As such, her goal is to continue with weight loss efforts.   Nutrition goals: She has agreed to the Category 2 Plan.   Exercise goals: As is.  Behavioral modification strategies: keeping healthy foods in the home, better snacking choices and planning for success.  Destiny [name] has agreed to follow-up with our clinic in 2-2.5 weeks. She was informed of the importance of frequent follow-up visits to maximize her success with intensive lifestyle modifications for her multiple health conditions.   Objective:   Blood pressure 112/70, pulse 70, temperature 97.6 F (36.4 C), height 5\' 5"  (1.651 m), weight 197 lb (89.4 kg), SpO2 99 %. Body mass index is 32.78 kg/m.  General: Cooperative, alert, well developed, in no acute distress. HEENT: Conjunctivae and lids unremarkable. Cardiovascular: Regular rhythm.  Lungs: Normal work of breathing. Neurologic: No focal deficits.   Lab Results  Component Value Date   CREATININE 0.71 10/04/2019   BUN 14 10/04/2019   NA 140 10/04/2019   K 4.7 10/04/2019   CL 101 10/04/2019   CO2 26 10/04/2019   Lab Results  Component Value Date   ALT 12 10/04/2019  AST 15 10/04/2019   ALKPHOS 67 10/04/2019   BILITOT 0.5 10/04/2019   Lab Results  Component Value Date   HGBA1C 5.6 01/09/2020   HGBA1C 5.6 10/04/2019   Lab Results  Component Value Date   INSULIN 10.7 01/09/2020   INSULIN 8.4 10/04/2019   Lab Results  Component Value Date   TSH 0.06 (L) 05/12/2020   Lab Results  Component Value Date   CHOL 180 01/09/2020   HDL 50  01/09/2020   LDLCALC 118 (H) 01/09/2020   TRIG 62 01/09/2020   CHOLHDL 3.6 10/04/2019   Lab Results  Component Value Date   WBC 7.1 01/09/2020   HGB 14.2 01/09/2020   HCT 43.1 01/09/2020   MCV 90 01/09/2020   PLT 310 01/09/2020   Lab Results  Component Value Date   IRON 109 10/04/2019   TIBC 355 10/04/2019   FERRITIN 74 10/04/2019   Attestation Statements:   Reviewed by clinician on day of visit: allergies, medications, problem list, medical history, surgical history, family history, social history, and previous encounter notes.  I, Insurance claims handler, CMA, am acting as Energy manager for [employer], DO.  I have reviewed the above documentation for accuracy and completeness, and I agree with the above. Destiny [name], D.O.  The 21st Century Cures Act was signed into law in 2016 which includes the topic of electronic health records.  This provides immediate access to information in MyChart.  This includes consultation notes, operative notes, office notes, lab results and pathology reports.  If you have any questions about what you read please let us know at your next visit so we can discuss your concerns and take corrective action if need be.  We are right here with you.

## 2020-05-12 ENCOUNTER — Other Ambulatory Visit: Payer: Self-pay

## 2020-05-12 ENCOUNTER — Other Ambulatory Visit (INDEPENDENT_AMBULATORY_CARE_PROVIDER_SITE_OTHER): Payer: Federal, State, Local not specified - PPO

## 2020-05-12 DIAGNOSIS — E038 Other specified hypothyroidism: Secondary | ICD-10-CM | POA: Diagnosis not present

## 2020-05-12 LAB — TSH: TSH: 0.06 u[IU]/mL — ABNORMAL LOW (ref 0.35–4.50)

## 2020-05-12 LAB — T3, FREE: T3, Free: 3.4 pg/mL (ref 2.3–4.2)

## 2020-05-12 LAB — T4, FREE: Free T4: 1.43 ng/dL (ref 0.60–1.60)

## 2020-05-13 NOTE — Progress Notes (Signed)
Please inform patient of the following:  Her TSH is too low but other labs are normal.  We probably need to back off on the Synthroid.  Would like to go to 125 mcg daily and have her come back in 4 to 6 weeks to recheck TSH in alone.  Placed in a new prescription.

## 2020-05-14 ENCOUNTER — Other Ambulatory Visit: Payer: Self-pay | Admitting: *Deleted

## 2020-05-14 DIAGNOSIS — E038 Other specified hypothyroidism: Secondary | ICD-10-CM

## 2020-05-14 MED ORDER — LEVOTHYROXINE SODIUM 125 MCG PO TABS: 125.0000 ug | ORAL_TABLET | Freq: Every day | ORAL | 3 refills | Status: DC

## 2020-05-25 ENCOUNTER — Encounter (INDEPENDENT_AMBULATORY_CARE_PROVIDER_SITE_OTHER): Payer: Self-pay | Admitting: Family Medicine

## 2020-05-26 ENCOUNTER — Ambulatory Visit (INDEPENDENT_AMBULATORY_CARE_PROVIDER_SITE_OTHER): Payer: Federal, State, Local not specified - PPO | Admitting: Family Medicine

## 2020-05-30 ENCOUNTER — Other Ambulatory Visit (INDEPENDENT_AMBULATORY_CARE_PROVIDER_SITE_OTHER): Payer: Self-pay | Admitting: Family Medicine

## 2020-05-30 DIAGNOSIS — E559 Vitamin D deficiency, unspecified: Secondary | ICD-10-CM

## 2020-06-23 ENCOUNTER — Other Ambulatory Visit (INDEPENDENT_AMBULATORY_CARE_PROVIDER_SITE_OTHER): Payer: Federal, State, Local not specified - PPO

## 2020-06-23 ENCOUNTER — Other Ambulatory Visit: Payer: Self-pay

## 2020-06-23 DIAGNOSIS — E038 Other specified hypothyroidism: Secondary | ICD-10-CM | POA: Diagnosis not present

## 2020-06-23 LAB — TSH: TSH: 0.16 u[IU]/mL — ABNORMAL LOW (ref 0.35–4.50)

## 2020-06-23 NOTE — Progress Notes (Signed)
Please inform patient of the following:  Her TSH is heading in the right direction but we need to make another small dose adjustment. Recommend decreasing her synthroid to daily and have her come back in 4-6 weeks to recheck TSH.  Katina Degree. Jimmey Ralph, MD 06/23/2020 2:56 PM

## 2020-06-25 ENCOUNTER — Other Ambulatory Visit: Payer: Self-pay | Admitting: *Deleted

## 2020-06-25 DIAGNOSIS — E038 Other specified hypothyroidism: Secondary | ICD-10-CM

## 2020-06-25 MED ORDER — LEVOTHYROXINE SODIUM 112 MCG PO TABS: 112.0000 ug | ORAL_TABLET | Freq: Every day | ORAL | 0 refills | Status: DC

## 2020-06-25 NOTE — Progress Notes (Signed)
Date of birth verified by patient  Lab results given,Pt verbalized understanding  Rx send to the pharmacy, Lab order plaed

## 2020-07-25 ENCOUNTER — Other Ambulatory Visit (INDEPENDENT_AMBULATORY_CARE_PROVIDER_SITE_OTHER): Payer: Federal, State, Local not specified - PPO

## 2020-07-25 ENCOUNTER — Other Ambulatory Visit: Payer: Self-pay

## 2020-07-25 ENCOUNTER — Telehealth: Payer: Self-pay

## 2020-07-25 DIAGNOSIS — E038 Other specified hypothyroidism: Secondary | ICD-10-CM

## 2020-07-25 LAB — TSH: TSH: 0.85 u[IU]/mL (ref 0.35–4.50)

## 2020-07-25 NOTE — Progress Notes (Signed)
Please inform patient of the following:  Her thyroid levels are at goal. We should keep her at her current level and we can recheck again in 6-12 months.  Would like for her to let us know if she starts to have any symptoms of low or high thyroid in the meantime and we can see her sooner.  Destiny [name]. Jimmey Ralph, MD 07/25/2020 3:03 PM

## 2020-07-25 NOTE — Telephone Encounter (Signed)
Please call pt and schedule an appt for next week to discuss medications. Thanks

## 2020-07-25 NOTE — Telephone Encounter (Signed)
Patient states that at apt in febuary dr Jimmey Ralph stated he would take over medications from previous dr.   Davis Gourd D, Ergocalciferol, (DRISDOL) 1.25 MG (50000 UNIT) CAPS capsule  buPROPion (WELLBUTRIN XL) 150 MG 24 hr tablet  [id number] - [place], [place] - [address]  Please advise  Patient understood she may need apt before refilling

## 2020-07-29 NOTE — Telephone Encounter (Signed)
Patient scheduled.

## 2020-07-30 NOTE — Telephone Encounter (Signed)
Pt returned call about lab work. Please advise.

## 2020-08-04 ENCOUNTER — Telehealth (INDEPENDENT_AMBULATORY_CARE_PROVIDER_SITE_OTHER): Payer: Federal, State, Local not specified - PPO | Admitting: Family Medicine

## 2020-08-04 DIAGNOSIS — E038 Other specified hypothyroidism: Secondary | ICD-10-CM | POA: Diagnosis not present

## 2020-08-04 DIAGNOSIS — E559 Vitamin D deficiency, unspecified: Secondary | ICD-10-CM

## 2020-08-04 DIAGNOSIS — F39 Unspecified mood [affective] disorder: Secondary | ICD-10-CM

## 2020-08-04 MED ORDER — BUPROPION HCL ER (XL) 300 MG PO TB24: 300.0000 mg | ORAL_TABLET | Freq: Every day | ORAL | 5 refills | Status: DC

## 2020-08-04 MED ORDER — VITAMIN D (ERGOCALCIFEROL) 1.25 MG (50000 UNIT) PO CAPS: ORAL_CAPSULE | ORAL | 11 refills | Status: DC

## 2020-08-04 NOTE — Progress Notes (Signed)
   Destiny [name] is a 50 y.o. female who presents today for a virtual office visit.  Assessment/Plan:  Chronic Problems Addressed Today: Mood disorder (HCC), with emotional eating On Wellbutrin.  We will increase to 300 mg daily.  She will follow-up with me in a few weeks via MyChart.  Vitamin D deficiency Will refill vitamin D today.  We can recheck again next blood draw.  Other specified hypothyroidism She is on Synthroid 112 mcg daily and tolerating well.  Last TSH was at goal.     Subjective:  HPI:  See A/p.         Objective/Observations  Physical Exam: Gen: NAD, resting comfortably Pulm: Normal work of breathing Neuro: Grossly normal, moves all extremities Psych: Normal affect and thought content  Virtual Visit via Video   I connected with Kayse Puccini Thorman on 08/04/20 at  4:00 PM EDT by a video enabled telemedicine application and verified that I am speaking with the correct person using two identifiers. The limitations of evaluation and management by telemedicine and the availability of in person appointments were discussed. The patient expressed understanding and agreed to proceed.   Patient location: Home Provider location: [place] [employer] Persons participating in the virtual visit: Myself and Patient     Katina Degree. Jimmey Ralph, MD 08/04/2020 10:46 AM

## 2020-08-04 NOTE — Assessment & Plan Note (Signed)
Will refill vitamin D today.  We can recheck again next blood draw.

## 2020-08-04 NOTE — Assessment & Plan Note (Signed)
She is on Synthroid 112 mcg daily and tolerating well.  Last TSH was at goal.

## 2020-08-04 NOTE — Assessment & Plan Note (Signed)
On Wellbutrin.  We will increase to 300 mg daily.  She will follow-up with me in a few weeks via MyChart.

## 2020-08-27 ENCOUNTER — Other Ambulatory Visit: Payer: Self-pay | Admitting: Family Medicine

## 2020-09-14 ENCOUNTER — Other Ambulatory Visit: Payer: Self-pay | Admitting: Family Medicine

## 2020-09-15 ENCOUNTER — Encounter: Payer: Self-pay | Admitting: Family Medicine

## 2020-09-15 MED ORDER — LEVOTHYROXINE SODIUM 112 MCG PO TABS: 112.0000 ug | ORAL_TABLET | Freq: Every day | ORAL | 0 refills | Status: DC

## 2020-10-13 ENCOUNTER — Encounter: Payer: Self-pay | Admitting: Family Medicine

## 2020-10-14 ENCOUNTER — Other Ambulatory Visit: Payer: Self-pay

## 2020-10-14 ENCOUNTER — Telehealth: Payer: Federal, State, Local not specified - PPO | Admitting: Registered Nurse

## 2020-10-14 ENCOUNTER — Encounter: Payer: Self-pay | Admitting: Registered Nurse

## 2020-10-14 DIAGNOSIS — U071 COVID-19: Secondary | ICD-10-CM | POA: Diagnosis not present

## 2020-10-14 MED ORDER — NIRMATRELVIR/RITONAVIR (PAXLOVID)TABLET: 3.0000 | ORAL_TABLET | Freq: Two times a day (BID) | ORAL | 0 refills | Status: AC

## 2020-10-14 NOTE — Telephone Encounter (Signed)
LVM for pt to call the office.

## 2020-10-14 NOTE — Progress Notes (Signed)
Telemedicine Encounter- SOAP NOTE Established Patient  This video encounter was conducted with the patient's (or proxy's) verbal consent via audio telecommunications: yes/no: Yes Patient was instructed to have this encounter in a suitably private space; and to only have persons present to whom they give permission to participate. In addition, patient identity was confirmed by use of name plus two identifiers (DOB and address).  I discussed the limitations, risks, security and privacy concerns of performing an evaluation and management service by telephone and the availability of in person appointments. I also discussed with the patient that there may be a patient responsible charge related to this service. The patient expressed understanding and agreed to proceed.  I spent a total of 19 minutes talking with the patient or their proxy.  Patient at home Provider in office  Participants: Jari Sportsman, NP and Lucianne Lei  Chief Complaint  Patient presents with   Covid Positive    Patient states she tested positive for covid yesterday. Patient states she has headache, She is taking Advil that seems to help some.     Subjective   Destiny [name] is a 50 y.o. established patient. video visit today for COVID+  HPI Symptoms onset Sunday afternoon - some malaise Monday morning with more malaise, fatigue, cough Positive test Monday (yesterday) Husband is also positive  Vaccinated - 2 doses and booster  Symptoms include congestion, cough, sneezing, headache, pnd No shob, some congestion feels like it's in chest No sore throat, nvd   Patient Active Problem List   Diagnosis Date Noted   At risk for diabetes mellitus 04/26/2020   Absolute anemia 04/07/2020   Dyslipidemia 04/03/2020   Mood disorder (HCC), with emotional eating 03/24/2020   Sleeping difficulty 03/10/2020   At risk for depression 03/10/2020   At risk for impaired metabolic function 01/28/2020   Other hyperlipidemia  01/14/2020   Other specified hypothyroidism 01/14/2020   Insulin resistance 01/14/2020   Vitamin D deficiency 01/14/2020   At risk for heart disease 01/14/2020    Past Medical History:  Diagnosis Date   AMA (advanced maternal age) multigravida 35+    Anemia    Asthma    exercise induced   Back pain    Dysrhythmia    SVT ab;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 18 g 0   buPROPion (WELLBUTRIN XL) 300 MG 24 hr tablet TAKE 1 TABLET BY MOUTH EVERY DAY 90 tablet 0   ferrous sulfate 325 (65 FE) MG EC tablet Take 325 mg by mouth daily with breakfast.     levothyroxine (SYNTHROID) 112 MCG tablet Take 1 tablet (112 mcg total) by mouth daily before breakfast. 90 tablet 0   MAGNESIUM CARBONATE PO Take 1,000 mg by mouth daily.     Multiple Vitamin (MULTIVITAMIN WITH MINERALS) TABS tablet Take 1 tablet by mouth daily.     vitamin C (ASCORBIC ACID) 500 MG tablet Take 500 mg by mouth daily.     Vitamin D, Ergocalciferol, (DRISDOL) 1.25 MG (50000 UNIT) CAPS capsule Take one tablet q 10 days 3 capsule  11   VITAMIN K PO Take 150 mcg by mouth daily.     Zinc Acetate, Oral, (ZINC ACETATE PO) Take by mouth.     No current facility-administered medications for this visit.    Allergies  Allergen Reactions   Adhesive [Tape]    Influenza Vaccines Hives    Water blisters on face and hives on chest and stomach and told not to get again    Social History   Socioeconomic History   Marital status: Married    Spouse name: Molly Maduro   Number of children: Not on file   Years of education: Not on file   Highest education level: Not on file   Occupational History   Occupation: stay at home mom  Tobacco Use   Smoking status: Never   Smokeless tobacco: Never  Vaping Use   Vaping Use: Never used  Substance and Sexual Activity   Alcohol use: No   Drug use: No   Sexual activity: Yes  Other Topics Concern   Not on file  Social History Narrative   Not on file   Social Determinants of Health   Financial Resource Strain: Not on file  Food Insecurity: Not on file  Transportation Needs: Not on file  Physical Activity: Not on file  Stress: Not on file  Social Connections: Not on file  Intimate Partner Violence: Not on file    ROS Per hpi   Objective   Vitals as reported by the patient: There were no vitals filed for this visit.  Talise was seen today for covid positive.  Diagnoses and all orders for this visit:  COVID-19 -     nirmatrelvir/ritonavir EUA (PAXLOVID) TABS; Take 3 tablets by mouth 2 (two) times daily for 5 days. (Take nirmatrelvir 150 mg two tablets twice daily for 5 days and ritonavir 100 mg one tablet twice daily for 5 days) Patient GFR is >60   PLAN Reviewed risks, benefits, AE, side effects of antivirals, pt voices understanding. Will opt for paxlovid as above.  Supportive care, both pharm and nonpharm discussed. Pt voices understanding. Return, ER, and isolation guidelines reviewed. Pt voices understanding. Patient encouraged to call clinic with any questions, comments, or concerns.  I discussed the assessment and treatment plan with the patient. The patient was provided an opportunity to ask questions and all were answered. The patient agreed with the plan and demonstrated an understanding of the instructions.   The patient was advised to call back or seek an in-person evaluation if the symptoms worsen or if the condition fails to improve as anticipated.  I provided 19 minutes of face-to-face time during this encounter.  Janeece Agee, NP

## 2020-10-24 DIAGNOSIS — M76822 Posterior tibial tendinitis, left leg: Secondary | ICD-10-CM | POA: Diagnosis not present

## 2020-10-24 DIAGNOSIS — M79672 Pain in left foot: Secondary | ICD-10-CM | POA: Diagnosis not present

## 2020-10-24 DIAGNOSIS — M25572 Pain in left ankle and joints of left foot: Secondary | ICD-10-CM | POA: Diagnosis not present

## 2020-11-23 ENCOUNTER — Other Ambulatory Visit: Payer: Self-pay | Admitting: Family Medicine

## 2020-11-25 DIAGNOSIS — M76822 Posterior tibial tendinitis, left leg: Secondary | ICD-10-CM | POA: Diagnosis not present

## 2020-11-25 DIAGNOSIS — M79672 Pain in left foot: Secondary | ICD-10-CM | POA: Diagnosis not present

## 2020-11-27 DIAGNOSIS — M76822 Posterior tibial tendinitis, left leg: Secondary | ICD-10-CM | POA: Diagnosis not present

## 2020-12-12 ENCOUNTER — Other Ambulatory Visit: Payer: Self-pay | Admitting: Family Medicine

## 2021-01-05 ENCOUNTER — Encounter: Payer: Self-pay | Admitting: Family Medicine

## 2021-01-26 DIAGNOSIS — Z1231 Encounter for screening mammogram for malignant neoplasm of breast: Secondary | ICD-10-CM | POA: Diagnosis not present

## 2021-01-28 ENCOUNTER — Other Ambulatory Visit: Payer: Self-pay | Admitting: Obstetrics and Gynecology

## 2021-01-28 DIAGNOSIS — R928 Other abnormal and inconclusive findings on diagnostic imaging of breast: Secondary | ICD-10-CM

## 2021-02-19 ENCOUNTER — Other Ambulatory Visit: Payer: Self-pay | Admitting: Family Medicine

## 2021-02-27 ENCOUNTER — Ambulatory Visit: Payer: Federal, State, Local not specified - PPO

## 2021-02-27 ENCOUNTER — Ambulatory Visit
Admission: RE | Admit: 2021-02-27 | Discharge: 2021-02-27 | Disposition: A | Discharge status: Home / Self Care | Payer: Federal, State, Local not specified - PPO | Source: Ambulatory Visit | Attending: Obstetrics and Gynecology | Admitting: Obstetrics and Gynecology

## 2021-02-27 DIAGNOSIS — R922 Inconclusive mammogram: Secondary | ICD-10-CM | POA: Diagnosis not present

## 2021-02-27 DIAGNOSIS — R928 Other abnormal and inconclusive findings on diagnostic imaging of breast: Secondary | ICD-10-CM

## 2021-02-27 LAB — HM MAMMOGRAPHY

## 2021-03-11 ENCOUNTER — Other Ambulatory Visit: Payer: Self-pay | Admitting: Family Medicine

## 2021-04-02 ENCOUNTER — Ambulatory Visit (INDEPENDENT_AMBULATORY_CARE_PROVIDER_SITE_OTHER): Payer: Federal, State, Local not specified - PPO

## 2021-04-02 ENCOUNTER — Ambulatory Visit: Payer: Federal, State, Local not specified - PPO | Admitting: Podiatry

## 2021-04-02 ENCOUNTER — Other Ambulatory Visit: Payer: Self-pay

## 2021-04-02 DIAGNOSIS — M76822 Posterior tibial tendinitis, left leg: Secondary | ICD-10-CM | POA: Diagnosis not present

## 2021-04-02 DIAGNOSIS — M79672 Pain in left foot: Secondary | ICD-10-CM

## 2021-04-02 DIAGNOSIS — M2142 Flat foot [pes planus] (acquired), left foot: Secondary | ICD-10-CM | POA: Diagnosis not present

## 2021-04-02 DIAGNOSIS — M2141 Flat foot [pes planus] (acquired), right foot: Secondary | ICD-10-CM | POA: Diagnosis not present

## 2021-04-02 NOTE — Patient Instructions (Signed)
I have ordered a MRI of the left ankle. If you do not hear for them about scheduling within the next 1 week, or you have any questions please give [place] a call at [phone].   For instructions on how to put on your Tri-Lock Ankle Brace, please visit [email or web address]

## 2021-04-03 ENCOUNTER — Encounter: Payer: Self-pay | Admitting: Podiatry

## 2021-04-03 MED ORDER — MELOXICAM 15 MG PO TABS: 15.0000 mg | ORAL_TABLET | Freq: Every day | ORAL | 0 refills | Status: DC | PRN

## 2021-04-05 NOTE — Progress Notes (Signed)
Subjective:   Patient ID: Destiny [name], female   DOB: 51 y.o.   MRN: [id number]   HPI 51 year old female presents the office today for concerns of left foot and ankle pain which started about 8 years ago.  She states at that time she was told that she had a tendon tear.  Over time her arch has been falling on her left foot.  She has pain mostly in the morning when going on the steps or fitting her foot quite a bit.  The pain is consistent.  She previously tried physical therapy as well as wearing a cam boot.  No recent treatment.  No recent injuries.   Review of Systems  All other systems reviewed and are negative.  Past Medical History:  Diagnosis Date   AMA (advanced maternal age) multigravida 35+    Anemia    Asthma    exercise induced   Back pain    Dysrhythmia    SVT ab;atopm 12/10   Herpes    Hypotension    Hypothyroidism    Joint pain    SVT (supraventricular tachycardia) (HCC)    Swelling of both lower extremities    Vitamin D deficiency     Past Surgical History:  Procedure Laterality Date   CARDIAC CATHETERIZATION  approx. 2007   catheter ablation   CESAREAN SECTION  03/20/2011   Procedure: CESAREAN SECTION;  Surgeon: Freddrick March. Tenny Craw, MD;  Location: [facility];  Service: Gynecology;  Laterality: N/A;  primary cesarean section of baby boy at 38  APGAR 9/9   CESAREAN SECTION WITH BILATERAL TUBAL LIGATION Bilateral 11/01/2017   Procedure: REPEAT CESAREAN SECTION WITH BILATERAL TUBAL LIGATION;  Surgeon: Waynard Reeds, MD;  Location: [facility]TH[facility] SU[facility];  Service: Obstetrics;  Laterality: Bilateral;  Heather,RNFA   DILATION AND EVACUATION N/A 10/11/2013   Procedure: DILATATION AND EVACUATION;  Surgeon: Loney Laurence, MD;  Location: [facility];  Service: Gynecology;  Laterality: N/A;   HAND SURGERY     NASAL SINUS SURGERY  2009   SVT ABLATION  11/2007     Current Outpatient Medications:    meloxicam (MOBIC) 15 MG tablet, Take 1 tablet (15 mg total) by mouth daily as needed  for pain., Disp: 30 tablet, Rfl: 0   albuterol (VENTOLIN HFA) 108 (90 Base) MCG/ACT inhaler, Inhale 2 puffs into the lungs every 6 (six) hours as needed for wheezing or shortness of breath., Disp: 18 g, Rfl: 0   buPROPion (WELLBUTRIN XL) 300 MG 24 hr tablet, TAKE 1 TABLET BY MOUTH EVERY DAY, Disp: 90 tablet, Rfl: 0   ferrous sulfate 325 (65 FE) MG EC tablet, Take 325 mg by mouth daily with breakfast., Disp: , Rfl:    levothyroxine (SYNTHROID) 112 MCG tablet, TAKE 1 TABLET BY MOUTH EVERY DAY BEFORE BREAKFAST, Disp: 90 tablet, Rfl: 0   MAGNESIUM CARBONATE PO, Take 1,000 mg by mouth daily., Disp: , Rfl:    Multiple Vitamin (MULTIVITAMIN WITH MINERALS) TABS tablet, Take 1 tablet by mouth daily., Disp: , Rfl:    vitamin C (ASCORBIC ACID) 500 MG tablet, Take 500 mg by mouth daily., Disp: , Rfl:    Vitamin D, Ergocalciferol, (DRISDOL) 1.25 MG (50000 UNIT) CAPS capsule, Take one tablet q 10 days, Disp: 3 capsule, Rfl: 11   VITAMIN K PO, Take 150 mcg by mouth daily., Disp: , Rfl:    Zinc Acetate, Oral, (ZINC ACETATE PO), Take by mouth., Disp: , Rfl:   Allergies  Allergen Reactions   Adhesive [Tape]  Influenza Vaccines Hives    Water blisters on face and hives on chest and stomach and told not to get again          Objective:  Physical Exam  General: AAO x3, NAD  Dermatological: Skin is warm, dry and supple bilateral. There are no open sores, no preulcerative lesions, no rash or signs of infection present.  Vascular: Dorsalis Pedis artery and Posterior Tibial artery pedal pulses are 2/4 bilateral with immedate capillary fill time.  There is no pain with calf compression, swelling, warmth, erythema.   Neruologic: Grossly intact via light touch bilateral.  Negative Tinel sign.  Musculoskeletal: There is a decreased medial arch upon weightbearing bilaterally.  She is able to do a double heel rise however she has difficulty on the left side upon with single heel rise.  Tenderness palpation  directly on the medial aspect of the ankle along the posterior tibial tendon.  Localized edema.  There is no erythema.  Ankle, subtalar joint range of motion intact.  Muscular strength 5/5 in all groups tested bilateral.  Gait: Unassisted, Nonantalgic.       Assessment:   51 year old female with posterior tibial tendon dysfunction, rule out tear     Plan:  -Treatment options discussed including all alternatives, risks, and complications -Etiology of symptoms were discussed -X-rays were obtained and reviewed with the patient.  Evidence of lack of this no evidence of acute fracture. -Given her ongoing nature of her symptoms I do recommend a repeat MRI as its been quite sometime since she had 1.  This is for potential surgical planning to further evaluate the tendon as does not visible on x-ray. -For now Tri-Lock ankle brace.  Prescribed meloxicam.  Icing daily.  Discussed shoes with good arch support as well.  Vivi Barrack DPM

## 2021-04-09 DIAGNOSIS — Z124 Encounter for screening for malignant neoplasm of cervix: Secondary | ICD-10-CM | POA: Diagnosis not present

## 2021-04-09 DIAGNOSIS — Z6838 Body mass index (BMI) 38.0-38.9, adult: Secondary | ICD-10-CM | POA: Diagnosis not present

## 2021-04-09 DIAGNOSIS — Z01419 Encounter for gynecological examination (general) (routine) without abnormal findings: Secondary | ICD-10-CM | POA: Diagnosis not present

## 2021-04-09 LAB — HM PAP SMEAR: HM Pap smear: NEGATIVE

## 2021-04-10 ENCOUNTER — Encounter: Payer: Self-pay | Admitting: Family Medicine

## 2021-04-12 ENCOUNTER — Ambulatory Visit
Admission: RE | Admit: 2021-04-12 | Discharge: 2021-04-12 | Disposition: A | Discharge status: Home / Self Care | Payer: Federal, State, Local not specified - PPO | Source: Ambulatory Visit | Attending: Podiatry | Admitting: Podiatry

## 2021-04-12 DIAGNOSIS — M7989 Other specified soft tissue disorders: Secondary | ICD-10-CM | POA: Diagnosis not present

## 2021-04-12 DIAGNOSIS — S86812A Strain of other muscle(s) and tendon(s) at lower leg level, left leg, initial encounter: Secondary | ICD-10-CM | POA: Diagnosis not present

## 2021-04-12 DIAGNOSIS — M76822 Posterior tibial tendinitis, left leg: Secondary | ICD-10-CM

## 2021-04-12 DIAGNOSIS — M25572 Pain in left ankle and joints of left foot: Secondary | ICD-10-CM | POA: Diagnosis not present

## 2021-04-14 ENCOUNTER — Encounter: Payer: Self-pay | Admitting: Podiatry

## 2021-04-15 NOTE — Telephone Encounter (Signed)
Lmom to call back to schedule EPAT - also left charges for the EPAT on machine.

## 2021-04-15 NOTE — Telephone Encounter (Signed)
I called the patient to go over the MRI results.  We discussed with conservative as well as surgical options.  She has been dealing with this for 8 years.  She also has a trip to [place] that she has later in the spring but she wants to be able to go on and surgery is not an option.  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  For now she is in continue with immobilization, ice, elevation.  Discussed alternative treatments including going back to physical therapy with dry needling other treatments as well as shockwave/EPAT. She wants to try EPAT.  I will send a message to the schedulers for this.

## 2021-04-15 NOTE — Telephone Encounter (Signed)
-----   Message from Trula Slade, DPM sent at 04/15/2021  2:22 PM EST ----- Can you please call her to schedule EPAT? She just tested positive for COVID today so it won't be at least until next week.   Also, I did not tell her the cost if you could let her know. Thanks!

## 2021-04-16 ENCOUNTER — Telehealth: Payer: Self-pay | Admitting: Podiatry

## 2021-04-16 NOTE — Telephone Encounter (Signed)
Pt scheduled for 1st epat 2.20.23

## 2021-04-20 ENCOUNTER — Other Ambulatory Visit: Payer: Self-pay

## 2021-04-20 ENCOUNTER — Ambulatory Visit (INDEPENDENT_AMBULATORY_CARE_PROVIDER_SITE_OTHER): Payer: Self-pay | Admitting: Podiatry

## 2021-04-20 DIAGNOSIS — M76822 Posterior tibial tendinitis, left leg: Secondary | ICD-10-CM

## 2021-04-20 DIAGNOSIS — B351 Tinea unguium: Secondary | ICD-10-CM

## 2021-04-20 NOTE — Patient Instructions (Signed)

## 2021-04-20 NOTE — Progress Notes (Signed)
Patient presents for the 1st EPAT treatment today with complaint of left heel pain. Diagnosed with Posterior tibial tendon dysfunction of the left lower extremity by Dr. Ardelle Anton. This has been ongoing for several months. The patient has tried ice, stretching, NSAIDS and supportive shoe gear with no Meinhart term relief.   Most of the pain is located posterior achilles tendon .  ESWT administered and tolerated well.Treatment settings initiated at:   Energy: 15  Ended treatment session today with 3000 shocks at the following settings:   Energy: 15  Frequency: 6.0  Joules: 14.72   Reviewed post EPAT instructions. Advised to avoid ice and NSAIDs throughout the treatment process and to utilize boot or supportive shoes for at least the next 3 days.  Follow up for 2nd treatment in 1 week.

## 2021-04-30 ENCOUNTER — Other Ambulatory Visit: Payer: Self-pay | Admitting: Podiatry

## 2021-05-15 ENCOUNTER — Other Ambulatory Visit: Payer: Self-pay | Admitting: Family Medicine

## 2021-05-22 ENCOUNTER — Other Ambulatory Visit: Payer: Federal, State, Local not specified - PPO

## 2021-05-28 ENCOUNTER — Other Ambulatory Visit: Payer: Self-pay | Admitting: Podiatry

## 2021-06-06 ENCOUNTER — Other Ambulatory Visit: Payer: Self-pay | Admitting: Family Medicine

## 2021-06-12 ENCOUNTER — Telehealth: Payer: Self-pay

## 2021-06-12 NOTE — Telephone Encounter (Signed)
LMOM to call back to schedule EPAT

## 2021-06-17 ENCOUNTER — Ambulatory Visit (INDEPENDENT_AMBULATORY_CARE_PROVIDER_SITE_OTHER): Payer: Federal, State, Local not specified - PPO

## 2021-06-17 DIAGNOSIS — M76822 Posterior tibial tendinitis, left leg: Secondary | ICD-10-CM

## 2021-06-17 DIAGNOSIS — B351 Tinea unguium: Secondary | ICD-10-CM

## 2021-06-17 NOTE — Progress Notes (Signed)
Patient presents for the 2nd EPAT treatment today with complaint of left heel pain. Diagnosed with Posterior tibial tendon dysfunction of the left lower extremity by Dr. Jacqualyn Posey. This has been ongoing for several months. The patient has tried ice, stretching, NSAIDS and supportive shoe gear with no Abplanalp term relief.  ? ?Most of the pain is located posterior achilles tendon . ? ?ESWT administered and tolerated well.Treatment settings initiated at: ? ? Energy: 20 ? ?Ended treatment session today with 3000 shocks at the following settings: ? ? Energy: 20 ? Frequency: 5.0 ? Joules: 19.62 ? ? ?Reviewed post EPAT instructions. Advised to avoid ice and NSAIDs throughout the treatment process and to utilize boot or supportive shoes for at least the next 3 days. ? ?Follow up for 3rd treatment in 1 week.  ?

## 2021-06-28 ENCOUNTER — Other Ambulatory Visit: Payer: Self-pay | Admitting: Podiatry

## 2021-07-27 ENCOUNTER — Other Ambulatory Visit: Payer: Self-pay | Admitting: Podiatry

## 2021-08-01 ENCOUNTER — Other Ambulatory Visit: Payer: Self-pay | Admitting: Family Medicine

## 2021-08-01 DIAGNOSIS — E559 Vitamin D deficiency, unspecified: Secondary | ICD-10-CM

## 2021-08-12 ENCOUNTER — Other Ambulatory Visit: Payer: Self-pay | Admitting: Family Medicine

## 2021-08-27 ENCOUNTER — Other Ambulatory Visit: Payer: Self-pay | Admitting: Podiatry

## 2021-09-03 ENCOUNTER — Other Ambulatory Visit: Payer: Self-pay | Admitting: Family Medicine

## 2021-09-09 ENCOUNTER — Other Ambulatory Visit: Payer: Self-pay | Admitting: Family Medicine

## 2021-09-26 ENCOUNTER — Other Ambulatory Visit: Payer: Self-pay | Admitting: Family Medicine

## 2021-09-29 ENCOUNTER — Other Ambulatory Visit: Payer: Self-pay | Admitting: Podiatry

## 2021-10-05 ENCOUNTER — Other Ambulatory Visit: Payer: Self-pay | Admitting: Podiatry

## 2021-10-07 ENCOUNTER — Encounter (INDEPENDENT_AMBULATORY_CARE_PROVIDER_SITE_OTHER): Payer: Self-pay

## 2021-10-16 ENCOUNTER — Other Ambulatory Visit: Payer: Self-pay | Admitting: Family Medicine

## 2021-10-16 ENCOUNTER — Telehealth: Payer: Self-pay | Admitting: Family Medicine

## 2021-10-16 NOTE — Telephone Encounter (Signed)
..   Encourage patient to contact the pharmacy for refills or they can request refills through [facility]  LAST APPOINTMENT DATE:  04/03/20  NEXT APPOINTMENT DATE: None  MEDICATION: levothyroxine (SYNTHROID) 112 MCG tablet   Is the patient out of medication? A week's dose left  PHARMACY: [id number] [place], [place] - [address]  [address] [place] [address]  Phone:  [phone]  Fax:  [phone]

## 2021-10-17 ENCOUNTER — Other Ambulatory Visit: Payer: Self-pay | Admitting: *Deleted

## 2021-10-17 MED ORDER — LEVOTHYROXINE SODIUM 112 MCG PO TABS: ORAL_TABLET | ORAL | 0 refills | Status: DC

## 2021-10-17 NOTE — Telephone Encounter (Signed)
Rx refilled, patient has OV on 10/20/2021

## 2021-10-20 ENCOUNTER — Ambulatory Visit: Payer: Federal, State, Local not specified - PPO | Admitting: Family Medicine

## 2021-10-20 ENCOUNTER — Encounter: Payer: Self-pay | Admitting: Family Medicine

## 2021-10-20 VITALS — BP 109/70 | HR 80 | Temp 98.1°F | Ht 65.0 in | Wt 234.0 lb

## 2021-10-20 DIAGNOSIS — Z23 Encounter for immunization: Secondary | ICD-10-CM

## 2021-10-20 DIAGNOSIS — F39 Unspecified mood [affective] disorder: Secondary | ICD-10-CM | POA: Diagnosis not present

## 2021-10-20 DIAGNOSIS — Z0001 Encounter for general adult medical examination with abnormal findings: Secondary | ICD-10-CM | POA: Diagnosis not present

## 2021-10-20 DIAGNOSIS — Z1159 Encounter for screening for other viral diseases: Secondary | ICD-10-CM

## 2021-10-20 DIAGNOSIS — E785 Hyperlipidemia, unspecified: Secondary | ICD-10-CM | POA: Diagnosis not present

## 2021-10-20 DIAGNOSIS — E8881 Metabolic syndrome: Secondary | ICD-10-CM

## 2021-10-20 DIAGNOSIS — E039 Hypothyroidism, unspecified: Secondary | ICD-10-CM

## 2021-10-20 DIAGNOSIS — E559 Vitamin D deficiency, unspecified: Secondary | ICD-10-CM

## 2021-10-20 LAB — VITAMIN D 25 HYDROXY (VIT D DEFICIENCY, FRACTURES): VITD: 47.77 ng/mL (ref 30.00–100.00)

## 2021-10-20 LAB — LIPID PANEL
Cholesterol: 190 mg/dL (ref 0–200)
HDL: 51.5 mg/dL
LDL Cholesterol: 111 mg/dL — ABNORMAL HIGH (ref 0–99)
NonHDL: 138.09
Total CHOL/HDL Ratio: 4
Triglycerides: 133 mg/dL (ref 0.0–149.0)
VLDL: 26.6 mg/dL (ref 0.0–40.0)

## 2021-10-20 LAB — COMPREHENSIVE METABOLIC PANEL WITH GFR
ALT: 14 U/L (ref 0–35)
AST: 15 U/L (ref 0–37)
Albumin: 4.4 g/dL (ref 3.5–5.2)
Alkaline Phosphatase: 43 U/L (ref 39–117)
BUN: 22 mg/dL (ref 6–23)
CO2: 24 meq/L (ref 19–32)
Calcium: 9.4 mg/dL (ref 8.4–10.5)
Chloride: 106 meq/L (ref 96–112)
Creatinine, Ser: 0.72 mg/dL (ref 0.40–1.20)
GFR: 97.87 mL/min
Glucose, Bld: 99 mg/dL (ref 70–99)
Potassium: 4.1 meq/L (ref 3.5–5.1)
Sodium: 138 meq/L (ref 135–145)
Total Bilirubin: 0.4 mg/dL (ref 0.2–1.2)
Total Protein: 6.9 g/dL (ref 6.0–8.3)

## 2021-10-20 LAB — HEMOGLOBIN A1C: Hgb A1c MFr Bld: 5.6 % (ref 4.6–6.5)

## 2021-10-20 LAB — CBC
HCT: 39.3 % (ref 36.0–46.0)
Hemoglobin: 13.2 g/dL (ref 12.0–15.0)
MCHC: 33.6 g/dL (ref 30.0–36.0)
MCV: 89.4 fl (ref 78.0–100.0)
Platelets: 295 10*3/uL (ref 150.0–400.0)
RBC: 4.4 Mil/uL (ref 3.87–5.11)
RDW: 12.7 % (ref 11.5–15.5)
WBC: 6.2 10*3/uL (ref 4.0–10.5)

## 2021-10-20 LAB — TSH: TSH: 0.46 u[IU]/mL (ref 0.35–5.50)

## 2021-10-20 MED ORDER — LEVOTHYROXINE SODIUM 112 MCG PO TABS
ORAL_TABLET | ORAL | 3 refills | Status: DC
Start: 2021-10-20 — End: 2022-11-09

## 2021-10-20 MED ORDER — VITAMIN D (ERGOCALCIFEROL) 1.25 MG (50000 UNIT) PO CAPS: ORAL_CAPSULE | ORAL | 11 refills | Status: DC

## 2021-10-20 MED ORDER — BUPROPION HCL ER (XL) 300 MG PO TB24: 300.0000 mg | ORAL_TABLET | Freq: Every day | ORAL | 1 refills | Status: DC

## 2021-10-20 NOTE — Progress Notes (Signed)
Chief Complaint:  Destiny [name] is a 51 y.o. female who presents today for her annual comprehensive physical exam.    Assessment/Plan:  Chronic Problems Addressed Today: Dyslipidemia Check lipids.  Discussed lifestyle modifications.  Mood disorder (HCC), with emotional eating Stable on Wellbutrin 3 mg daily.  Vitamin D deficiency On vitamin D supplementation.  Check vitamin D level today.  Insulin resistance Check A1c.  Hypothyroidism Check TSH.  She is on Synthroid 112 mcg daily.  Doing well.   Preventative Healthcare: Flu shot and tetanus vaccine given today.  Will order Cologuard.  Check labs.  Gets Pap and mammogram done through GYN.  Patient Counseling(The following topics were reviewed and/or handout was given):  -Nutrition: Stressed importance of moderation in sodium/caffeine intake, saturated fat and cholesterol, caloric balance, sufficient intake of fresh fruits, vegetables, and fiber.  -Stressed the importance of regular exercise.   -Substance Abuse: Discussed cessation/primary prevention of tobacco, alcohol, or other drug use; driving or other dangerous activities under the influence; availability of treatment for abuse.   -Injury prevention: Discussed safety belts, safety helmets, smoke detector, smoking near bedding or upholstery.   -Sexuality: Discussed sexually transmitted diseases, partner selection, use of condoms, avoidance of unintended pregnancy and contraceptive alternatives.   -Dental health: Discussed importance of regular tooth brushing, flossing, and dental visits.  -Health maintenance and immunizations reviewed. Please refer to Health maintenance section.  Return to care in 1 year for next preventative visit.     Subjective:  HPI:  She has no acute complaints today.   Lifestyle Diet: None specific.  Exercise: None specific. Limited due to foot pain.      10/04/2019   10:55 AM  Depression screen PHQ 2/9  Decreased Interest 2  Down,  Depressed, Hopeless 1  PHQ - 2 Score 3  Altered sleeping 1  Change in appetite 2  Feeling bad or failure about yourself  3  Trouble concentrating 2  Moving slowly or fidgety/restless 0  Suicidal thoughts 0  PHQ-9 Score 11  Difficult doing work/chores Not difficult at all   Health Maintenance Due  Topic Date Due   Hepatitis C Screening  Never done   Fecal DNA (Cologuard)  Never done   TETANUS/TDAP  03/20/2021   INFLUENZA VACCINE  09/29/2021     ROS: Per HPI, otherwise a complete review of systems was negative.   PMH:  The following were reviewed and entered/updated in epic: Past Medical History:  Diagnosis Date   AMA (advanced maternal age) multigravida 35+    Anemia    Asthma    exercise induced   Back pain    Dysrhythmia    SVT ab;atopm 12/10   Herpes    Hypotension    Hypothyroidism    Joint pain    SVT (supraventricular tachycardia) (HCC)    Swelling of both lower extremities    Vitamin D deficiency    Patient Active Problem List   Diagnosis Date Noted   Dyslipidemia 04/03/2020   Mood disorder (HCC), with emotional eating 03/24/2020   Hypothyroidism 01/14/2020   Insulin resistance 01/14/2020   Vitamin D deficiency 01/14/2020   Past Surgical History:  Procedure Laterality Date   CARDIAC CATHETERIZATION  approx. 2007   catheter ablation   CESAREAN SECTION  03/20/2011   Procedure: CESAREAN SECTION;  Surgeon: Freddrick March. Tenny Craw, MD;  Location: [facility];  Service: Gynecology;  Laterality: N/A;  primary cesarean section of baby boy at 89  APGAR 9/9   CESAREAN SECTION WITH BILATERAL  TUBAL LIGATION Bilateral 11/01/2017   Procedure: REPEAT CESAREAN SECTION WITH BILATERAL TUBAL LIGATION;  Surgeon: Waynard Reeds, MD;  Location: [facility] BIRTHING SU[facility];  Service: Obstetrics;  Laterality: Bilateral;  Heather,RNFA   DILATION AND EVACUATION N/A 10/11/2013   Procedure: DILATATION AND EVACUATION;  Surgeon: Loney Laurence, MD;  Location: [facility];  Service: Gynecology;  Laterality:  N/A;   HAND SURGERY     NASAL SINUS SURGERY  2009   SVT ABLATION  11/2007    Family History  Problem Relation Age of Onset   Thyroid disease Mother    Cancer Mother        breast   Liver disease Mother        autoimmune hepatitis   Hypothyroidism Mother    Hypertension Father    Heart disease Father    Hyperlipidemia Father    Thyroid disease Father    Sleep apnea Father    Obesity Father    Heart disease Paternal Grandfather    Hypertension Paternal Grandfather    Hypothyroidism Paternal Grandfather    Stroke Maternal Aunt    Dysrhythmia Maternal Aunt    Cancer Paternal Grandmother        lung   Hypothyroidism Paternal Grandmother    Hypothyroidism Paternal Aunt     Medications- reviewed and updated Current Outpatient Medications  Medication Sig Dispense Refill   ferrous sulfate 325 (65 FE) MG EC tablet Take 325 mg by mouth daily with breakfast.     MAGNESIUM CARBONATE PO Take 1,000 mg by mouth daily.     meloxicam (MOBIC) 15 MG tablet TAKE 1 TABLET BY MOUTH EVERY DAY AS NEEDED FOR PAIN 30 tablet 0   Multiple Vitamin (MULTIVITAMIN WITH MINERALS) TABS tablet Take 1 tablet by mouth daily.     vitamin C (ASCORBIC ACID) 500 MG tablet Take 500 mg by mouth daily.     VITAMIN K PO Take 150 mcg by mouth daily.     Zinc Acetate, Oral, (ZINC ACETATE PO) Take by mouth.     albuterol (VENTOLIN HFA) 108 (90 Base) MCG/ACT inhaler Inhale 2 puffs into the lungs every 6 (six) hours as needed for wheezing or shortness of breath. (Patient not taking: Reported on 10/20/2021) 18 g 0   buPROPion (WELLBUTRIN XL) 300 MG 24 hr tablet Take 1 tablet (300 mg total) by mouth daily. 90 tablet 1   levothyroxine (SYNTHROID) 112 MCG tablet TAKE 1 TABLET BY MOUTH EVERY DAY BEFORE BREAKFAST 90 tablet 3   Vitamin D, Ergocalciferol, (DRISDOL) 1.25 MG (50000 UNIT) CAPS capsule Take one tablet q 10 days 3 capsule 11   No current facility-administered medications for this visit.    Allergies-reviewed and  updated Allergies  Allergen Reactions   Adhesive [Tape]    Influenza Vaccines Hives    Water blisters on face and hives on chest and stomach and told not to get again    Social History   Socioeconomic History   Marital status: Married    Spouse name: Molly Maduro   Number of children: Not on file   Years of education: Not on file   Highest education level: Not on file  Occupational History   Occupation: stay at home mom  Tobacco Use   Smoking status: Never   Smokeless tobacco: Never  Vaping Use   Vaping Use: Never used  Substance and Sexual Activity   Alcohol use: No   Drug use: No   Sexual activity: Yes  Other Topics Concern   Not on file  Social History Narrative   Not on file   Social Determinants of Health   Financial Resource Strain: Low Risk  (10/18/2017)   Overall Financial Resource Strain (CARDIA)    Difficulty of Paying Living Expenses: Not hard at all  Food Insecurity: No Food Insecurity (10/18/2017)   Hunger Vital Sign    Worried About Running Out of Food in the Last Year: Never true    Ran Out of Food in the Last Year: Never true  Transportation Needs: No Transportation Needs (10/18/2017)   PRAPARE - Administrator, Civil Service (Medical): No    Lack of Transportation (Non-Medical): No  Physical Activity: Insufficiently Active (10/18/2017)   Exercise Vital Sign    Days of Exercise per Week: 3 days    Minutes of Exercise per Session: 30 min  Stress: No Stress Concern Present (10/18/2017)   [employer] of Occupational Health - Occupational Stress Questionnaire    Feeling of Stress : Only a little  Social Connections: Not on file        Objective:  Physical Exam: BP 109/70   Pulse 80   Temp 98.1 F (36.7 C) (Temporal)   Ht 5\' 5"  (1.651 m)   Wt 234 lb (106.1 kg)   LMP  (LMP Unknown)   SpO2 97%   Breastfeeding No   BMI 38.94 kg/m   Body mass index is 38.94 kg/m. Wt Readings from Last 3 Encounters:  10/20/21 234 lb (106.1 kg)   05/05/20 197 lb (89.4 kg)  04/21/20 198 lb (89.8 kg)   Gen: NAD, resting comfortably HEENT: TMs normal bilaterally. OP clear. No thyromegaly noted.  CV: RRR with no murmurs appreciated Pulm: NWOB, CTAB with no crackles, wheezes, or rhonchi GI: Normal bowel sounds present. Soft, Nontender, Nondistended. MSK: no edema, cyanosis, or clubbing noted Skin: warm, dry Neuro: CN2-12 grossly intact. Strength 5/5 in upper and lower extremities. Reflexes symmetric and intact bilaterally.  Psych: Normal affect and thought content     Starlina Lapre M. 04/23/20, MD 10/20/2021 10:59 AM

## 2021-10-20 NOTE — Assessment & Plan Note (Signed)
Check lipids. Discussed lifestyle modifications.  

## 2021-10-20 NOTE — Assessment & Plan Note (Signed)
On vitamin D supplementation.  Check vitamin D level today. ?

## 2021-10-20 NOTE — Assessment & Plan Note (Signed)
Stable on Wellbutrin 3 mg daily.

## 2021-10-20 NOTE — Assessment & Plan Note (Signed)
Check A1c. 

## 2021-10-20 NOTE — Addendum Note (Signed)
Addended by: Dyann Kief on: 10/20/2021 11:33 AM   Modules accepted: Orders

## 2021-10-20 NOTE — Assessment & Plan Note (Signed)
Check TSH.  She is on Synthroid 112 mcg daily.  Doing well.

## 2021-10-20 NOTE — Patient Instructions (Signed)
It was very nice to see you today!  We will check blood work today.  We will give your tetanus and flu vaccine today.  Please continue to work on diet and exercise.  Please come back in 1 year for your next physical.  Come back sooner if needed.  Take care, Dr Jerline Pain  PLEASE NOTE:  If you had any lab tests please let us know if you have not heard back within a few days. You may see your results on mychart before we have a chance to review them but we will give you a call once they are reviewed by [place]. If we ordered any referrals today, please let us know if you have not heard from their office within the next week.   Please try these tips to maintain a healthy lifestyle:  Eat at least 3 REAL meals and 1-2 snacks per day.  Aim for no more than 5 hours between eating.  If you eat breakfast, please do so within one hour of getting up.   Each meal should contain half fruits/vegetables, one quarter protein, and one quarter carbs (no bigger than a computer mouse)  Cut down on sweet beverages. This includes juice, soda, and sweet tea.   Drink at least 1 glass of water with each meal and aim for at least 8 glasses per day  Exercise at least 150 minutes every week.    Preventive Care 46-42 Years Old, Female Preventive care refers to lifestyle choices and visits with your health care provider that can promote health and wellness. Preventive care visits are also called wellness exams. What can I expect for my preventive care visit? Counseling Your health care provider may ask you questions about your: Medical history, including: Past medical problems. Family medical history. Pregnancy history. Current health, including: Menstrual cycle. Method of birth control. Emotional well-being. Home life and relationship well-being. Sexual activity and sexual health. Lifestyle, including: Alcohol, nicotine or tobacco, and drug use. Access to firearms. Diet, exercise, and sleep habits. Work  and work Statistician. Sunscreen use. Safety issues such as seatbelt and bike helmet use. Physical exam Your health care provider will check your: Height and weight. These may be used to calculate your BMI (body mass index). BMI is a measurement that tells if you are at a healthy weight. Waist circumference. This measures the distance around your waistline. This measurement also tells if you are at a healthy weight and may help predict your risk of certain diseases, such as type 2 diabetes and high blood pressure. Heart rate and blood pressure. Body temperature. Skin for abnormal spots. What immunizations do I need?  Vaccines are usually given at various ages, according to a schedule. Your health care provider will recommend vaccines for you based on your age, medical history, and lifestyle or other factors, such as travel or where you work. What tests do I need? Screening Your health care provider may recommend screening tests for certain conditions. This may include: Lipid and cholesterol levels. Diabetes screening. This is done by checking your blood sugar (glucose) after you have not eaten for a while (fasting). Pelvic exam and Pap test. Hepatitis B test. Hepatitis C test. HIV (human immunodeficiency virus) test. STI (sexually transmitted infection) testing, if you are at risk. Lung cancer screening. Colorectal cancer screening. Mammogram. Talk with your health care provider about when you should start having regular mammograms. This may depend on whether you have a family history of breast cancer. BRCA-related cancer screening. This may be  done if you have a family history of breast, ovarian, tubal, or peritoneal cancers. Bone density scan. This is done to screen for osteoporosis. Talk with your health care provider about your test results, treatment options, and if necessary, the need for more tests. Follow these instructions at home: Eating and drinking  Eat a diet that includes  fresh fruits and vegetables, whole grains, lean protein, and low-fat dairy products. Take vitamin and mineral supplements as recommended by your health care provider. Do not drink alcohol if: Your health care provider tells you not to drink. You are pregnant, may be pregnant, or are planning to become pregnant. If you drink alcohol: Limit how much you have to 0-1 drink a day. Know how much alcohol is in your drink. In the [place] one drink equals one 12 oz bottle of beer (355 mL), one 5 oz glass of wine (148 mL), or one 1 oz glass of hard liquor (44 mL). Lifestyle Brush your teeth every morning and night with fluoride toothpaste. Floss one time each day. Exercise for at least 30 minutes 5 or more days each week. Do not use any products that contain nicotine or tobacco. These products include cigarettes, chewing tobacco, and vaping devices, such as e-cigarettes. If you need help quitting, ask your health care provider. Do not use drugs. If you are sexually active, practice safe sex. Use a condom or other form of protection to prevent STIs. If you do not wish to become pregnant, use a form of birth control. If you plan to become pregnant, see your health care provider for a prepregnancy visit. Take aspirin only as told by your health care provider. Make sure that you understand how much to take and what form to take. Work with your health care provider to find out whether it is safe and beneficial for you to take aspirin daily. Find healthy ways to manage stress, such as: Meditation, yoga, or listening to music. Journaling. Talking to a trusted person. Spending time with friends and family. Minimize exposure to UV radiation to reduce your risk of skin cancer. Safety Always wear your seat belt while driving or riding in a vehicle. Do not drive: If you have been drinking alcohol. Do not ride with someone who has been drinking. When you are tired or distracted. While texting. If you have been  using any mind-altering substances or drugs. Wear a helmet and other protective equipment during sports activities. If you have firearms in your house, make sure you follow all gun safety procedures. Seek help if you have been physically or sexually abused. What's next? Visit your health care provider once a year for an annual wellness visit. Ask your health care provider how often you should have your eyes and teeth checked. Stay up to date on all vaccines. This information is not intended to replace advice given to you by your health care provider. Make sure you discuss any questions you have with your health care provider. Document Revised: 08/13/2020 Document Reviewed: 08/13/2020 Elsevier Patient Education  [place].

## 2021-10-21 LAB — HEPATITIS C ANTIBODY: Hepatitis C Ab: NONREACTIVE

## 2021-10-23 NOTE — Progress Notes (Signed)
Please inform patient of the following:  Her "bad" cholesterol is a little elevated.  Everything else is stable.  Would like for her to keep up the good work and we can recheck in a year.

## 2021-11-03 ENCOUNTER — Encounter: Payer: Self-pay | Admitting: Family Medicine

## 2021-11-03 ENCOUNTER — Other Ambulatory Visit: Payer: Self-pay | Admitting: *Deleted

## 2021-11-03 DIAGNOSIS — Z1211 Encounter for screening for malignant neoplasm of colon: Secondary | ICD-10-CM

## 2021-11-25 LAB — COLOGUARD: COLOGUARD: NEGATIVE

## 2021-11-25 NOTE — Progress Notes (Signed)
Please inform patient of the following:  Great news! Cologuard is negative. We can recheck in 3 years.  Destiny [name]. Jerline Pain, MD 11/25/2021 9:06 AM

## 2022-01-26 ENCOUNTER — Other Ambulatory Visit: Payer: Self-pay | Admitting: Obstetrics and Gynecology

## 2022-01-26 DIAGNOSIS — Z1231 Encounter for screening mammogram for malignant neoplasm of breast: Secondary | ICD-10-CM

## 2022-03-17 ENCOUNTER — Ambulatory Visit
Admission: RE | Admit: 2022-03-17 | Discharge: 2022-03-17 | Disposition: A | Discharge status: Home / Self Care | Payer: Federal, State, Local not specified - PPO | Source: Ambulatory Visit | Attending: Obstetrics and Gynecology | Admitting: Obstetrics and Gynecology

## 2022-03-17 DIAGNOSIS — Z1231 Encounter for screening mammogram for malignant neoplasm of breast: Secondary | ICD-10-CM

## 2022-03-19 ENCOUNTER — Other Ambulatory Visit: Payer: Self-pay | Admitting: Obstetrics and Gynecology

## 2022-03-19 DIAGNOSIS — R928 Other abnormal and inconclusive findings on diagnostic imaging of breast: Secondary | ICD-10-CM

## 2022-03-31 ENCOUNTER — Telehealth: Payer: Federal, State, Local not specified - PPO | Admitting: Physician Assistant

## 2022-03-31 DIAGNOSIS — B9689 Other specified bacterial agents as the cause of diseases classified elsewhere: Secondary | ICD-10-CM

## 2022-03-31 DIAGNOSIS — J019 Acute sinusitis, unspecified: Secondary | ICD-10-CM | POA: Diagnosis not present

## 2022-03-31 MED ORDER — AMOXICILLIN-POT CLAVULANATE 875-125 MG PO TABS: 1.0000 | ORAL_TABLET | Freq: Two times a day (BID) | ORAL | 0 refills | Status: DC

## 2022-03-31 NOTE — Progress Notes (Signed)

## 2022-03-31 NOTE — Progress Notes (Signed)
I have spent 5 minutes in review of e-visit questionnaire, review and updating patient chart, medical decision making and response to patient.   Callan Norden Cody Deryl Ports, PA-C    

## 2022-04-14 DIAGNOSIS — Z01419 Encounter for gynecological examination (general) (routine) without abnormal findings: Secondary | ICD-10-CM | POA: Diagnosis not present

## 2022-04-14 DIAGNOSIS — Z6839 Body mass index (BMI) 39.0-39.9, adult: Secondary | ICD-10-CM | POA: Diagnosis not present

## 2022-04-15 ENCOUNTER — Ambulatory Visit: Payer: Federal, State, Local not specified - PPO

## 2022-04-15 ENCOUNTER — Ambulatory Visit: Admission: RE | Admit: 2022-04-15 | Payer: Federal, State, Local not specified - PPO | Source: Ambulatory Visit

## 2022-04-15 ENCOUNTER — Ambulatory Visit
Admission: RE | Admit: 2022-04-15 | Discharge: 2022-04-15 | Disposition: A | Discharge status: Home / Self Care | Payer: Federal, State, Local not specified - PPO | Source: Ambulatory Visit | Attending: Obstetrics and Gynecology | Admitting: Obstetrics and Gynecology

## 2022-04-15 DIAGNOSIS — R928 Other abnormal and inconclusive findings on diagnostic imaging of breast: Secondary | ICD-10-CM

## 2022-06-24 DIAGNOSIS — M6281 Muscle weakness (generalized): Secondary | ICD-10-CM | POA: Diagnosis not present

## 2022-06-24 DIAGNOSIS — N393 Stress incontinence (female) (male): Secondary | ICD-10-CM | POA: Diagnosis not present

## 2022-07-01 ENCOUNTER — Telehealth: Payer: Self-pay | Admitting: Family Medicine

## 2022-07-01 NOTE — Telephone Encounter (Signed)
PT NEEDS ENOUGH UNTIL AUGUST.   Prescription Request  07/01/2022  LOV: 10/20/2021  What is the name of the medication or equipment?  buPROPion (WELLBUTRIN XL) 300 MG 24 hr tablet   Have you contacted your pharmacy to request a refill? Yes   Which pharmacy would you like this sent to?  [id number] [place], [place] - [address] [place] [address] Phone: [phone] Fax: [phone]    Patient notified that their request is being sent to the clinical staff for review and that they should receive a response within 2 business days.   Please advise at Mobile [phone] (mobile)

## 2022-07-02 ENCOUNTER — Other Ambulatory Visit: Payer: Self-pay | Admitting: *Deleted

## 2022-07-02 MED ORDER — BUPROPION HCL ER (XL) 300 MG PO TB24: 300.0000 mg | ORAL_TABLET | Freq: Every day | ORAL | 1 refills | Status: DC

## 2022-07-02 NOTE — Telephone Encounter (Signed)
Rx send to [facility]  

## 2022-08-08 ENCOUNTER — Other Ambulatory Visit: Payer: Self-pay | Admitting: Family Medicine

## 2022-08-24 DIAGNOSIS — Q120 Congenital cataract: Secondary | ICD-10-CM | POA: Diagnosis not present

## 2022-08-24 DIAGNOSIS — H52203 Unspecified astigmatism, bilateral: Secondary | ICD-10-CM | POA: Diagnosis not present

## 2022-11-06 ENCOUNTER — Other Ambulatory Visit: Payer: Self-pay | Admitting: Family Medicine

## 2022-11-06 DIAGNOSIS — E559 Vitamin D deficiency, unspecified: Secondary | ICD-10-CM

## 2022-11-09 ENCOUNTER — Encounter: Payer: Self-pay | Admitting: Family Medicine

## 2022-11-09 ENCOUNTER — Other Ambulatory Visit: Payer: Self-pay | Admitting: *Deleted

## 2022-11-09 MED ORDER — LEVOTHYROXINE SODIUM 112 MCG PO TABS: ORAL_TABLET | ORAL | 0 refills | Status: DC

## 2022-12-06 ENCOUNTER — Other Ambulatory Visit: Payer: Self-pay | Admitting: Family Medicine

## 2022-12-18 ENCOUNTER — Telehealth: Payer: Federal, State, Local not specified - PPO | Admitting: Family Medicine

## 2022-12-18 DIAGNOSIS — N390 Urinary tract infection, site not specified: Secondary | ICD-10-CM

## 2022-12-18 MED ORDER — NITROFURANTOIN MONOHYD MACRO 100 MG PO CAPS: 100.0000 mg | ORAL_CAPSULE | Freq: Two times a day (BID) | ORAL | 0 refills | Status: AC

## 2022-12-18 NOTE — Progress Notes (Signed)
E-Visit for Urinary Problems  We are sorry that you are not feeling well.  Here is how we plan to help!  Based on what you shared with me it looks like you most likely have a simple urinary tract infection.  A UTI (Urinary Tract Infection) is a bacterial infection of the bladder.  Most cases of urinary tract infections are simple to treat but a key part of your care is to encourage you to drink plenty of fluids and watch your symptoms carefully.  I have prescribed MacroBid 100 mg twice a day for 5 days.  Your symptoms should gradually improve. Call us if the burning in your urine worsens, you develop worsening fever, back pain or pelvic pain or if your symptoms do not resolve after completing the antibiotic.  Urinary tract infections can be prevented by drinking plenty of water to keep your body hydrated.  Also be sure when you wipe, wipe from front to back and don't hold it in!  If possible, empty your bladder every 4 hours.  HOME CARE Drink plenty of fluids Compete the full course of the antibiotics even if the symptoms resolve Remember, when you need to go.go. Holding in your urine can increase the likelihood of getting a UTI! GET HELP RIGHT AWAY IF: You cannot urinate You get a high fever Worsening back pain occurs You see blood in your urine You feel sick to your stomach or throw up You feel like you are going to pass out  MAKE SURE YOU  Understand these instructions. Will watch your condition. Will get help right away if you are not doing well or get worse.   Thank you for choosing an e-visit.  Your e-visit answers were reviewed by a board certified advanced clinical practitioner to complete your personal care plan. Depending upon the condition, your plan could have included both over the counter or prescription medications.  Please review your pharmacy choice. Make sure the pharmacy is open so you can pick up prescription now. If there is a problem, you may contact your  provider through [employer] and have the prescription routed to another pharmacy.  Your safety is important to [place]. If you have drug allergies check your prescription carefully.   For the next 24 hours you can use MyChart to ask questions about today's visit, request a non-urgent call back, or ask for a work or school excuse. You will get an email in the next two days asking about your experience. I hope that your e-visit has been valuable and will speed your recovery.  I have spent 5 minutes in review of e-visit questionnaire, review and updating patient chart, medical decision making and response to patient.   Reed Pandy, PA-C

## 2022-12-20 ENCOUNTER — Encounter: Payer: Self-pay | Admitting: Family Medicine

## 2022-12-20 ENCOUNTER — Ambulatory Visit (INDEPENDENT_AMBULATORY_CARE_PROVIDER_SITE_OTHER): Payer: Federal, State, Local not specified - PPO | Admitting: Family Medicine

## 2022-12-20 VITALS — BP 121/75 | HR 84 | Temp 96.6°F | Ht 65.0 in | Wt 238.6 lb

## 2022-12-20 DIAGNOSIS — E785 Hyperlipidemia, unspecified: Secondary | ICD-10-CM

## 2022-12-20 DIAGNOSIS — F39 Unspecified mood [affective] disorder: Secondary | ICD-10-CM

## 2022-12-20 DIAGNOSIS — E559 Vitamin D deficiency, unspecified: Secondary | ICD-10-CM

## 2022-12-20 DIAGNOSIS — E039 Hypothyroidism, unspecified: Secondary | ICD-10-CM | POA: Diagnosis not present

## 2022-12-20 DIAGNOSIS — E88819 Insulin resistance, unspecified: Secondary | ICD-10-CM

## 2022-12-20 DIAGNOSIS — Z0000 Encounter for general adult medical examination without abnormal findings: Secondary | ICD-10-CM

## 2022-12-20 LAB — LIPID PANEL
Cholesterol: 216 mg/dL — ABNORMAL HIGH (ref 0–200)
HDL: 60.6 mg/dL (ref >39.00)
LDL Cholesterol: 137 mg/dL — ABNORMAL HIGH (ref 0–99)
NonHDL: 155.07
Total CHOL/HDL Ratio: 4
Triglycerides: 89 mg/dL (ref 0.0–149.0)
VLDL: 17.8 mg/dL (ref 0.0–40.0)

## 2022-12-20 LAB — HEMOGLOBIN A1C: Hgb A1c MFr Bld: 5.5 % (ref 4.6–6.5)

## 2022-12-20 LAB — COMPREHENSIVE METABOLIC PANEL
ALT: 13 U/L (ref 0–35)
AST: 12 U/L (ref 0–37)
Albumin: 4.5 g/dL (ref 3.5–5.2)
Alkaline Phosphatase: 47 U/L (ref 39–117)
BUN: 14 mg/dL (ref 6–23)
CO2: 26 meq/L (ref 19–32)
Calcium: 9.3 mg/dL (ref 8.4–10.5)
Chloride: 105 meq/L (ref 96–112)
Creatinine, Ser: 0.85 mg/dL (ref 0.40–1.20)
GFR: 79.54 mL/min (ref >60.00)
Glucose, Bld: 104 mg/dL — ABNORMAL HIGH (ref 70–99)
Potassium: 4.1 meq/L (ref 3.5–5.1)
Sodium: 138 meq/L (ref 135–145)
Total Bilirubin: 0.7 mg/dL (ref 0.2–1.2)
Total Protein: 7.3 g/dL (ref 6.0–8.3)

## 2022-12-20 LAB — CBC
HCT: 43.2 % (ref 36.0–46.0)
Hemoglobin: 13.9 g/dL (ref 12.0–15.0)
MCHC: 32.2 g/dL (ref 30.0–36.0)
MCV: 90.1 fL (ref 78.0–100.0)
Platelets: 372 10*3/uL (ref 150.0–400.0)
RBC: 4.8 Mil/uL (ref 3.87–5.11)
RDW: 13.3 % (ref 11.5–15.5)
WBC: 6.4 10*3/uL (ref 4.0–10.5)

## 2022-12-20 LAB — TSH: TSH: 0.44 u[IU]/mL (ref 0.35–5.50)

## 2022-12-20 LAB — VITAMIN D 25 HYDROXY (VIT D DEFICIENCY, FRACTURES): VITD: 29.76 ng/mL — ABNORMAL LOW (ref 30.00–100.00)

## 2022-12-20 NOTE — Assessment & Plan Note (Signed)
Mood stable on Wellbutrin 300 mg daily.  No significant side effects.

## 2022-12-20 NOTE — Assessment & Plan Note (Signed)
Check vitamin D. 

## 2022-12-20 NOTE — Progress Notes (Signed)
Chief Complaint:  Destiny [name] is a 52 y.o. female who presents today for her annual comprehensive physical exam.    Assessment/Plan:  Chronic Problems Addressed Today: Hypothyroidism Check TSH.  She is on Synthroid 112 mcg daily.  Dyslipidemia Check lipids.  Discussed lifestyle modifications.  Insulin resistance Check A1c.  Vitamin D deficiency Check vitamin D.  Mood disorder (HCC), with emotional eating Mood stable on Wellbutrin 300 mg daily.  No significant side effects.  Preventative Healthcare: Check labs.  She will be getting COVID-vaccine soon.  Otherwise up-to-date on vaccines.  Up-to-date on Pap and mammogram.  Up-to-date on colon cancer screening.  Patient Counseling(The following topics were reviewed and/or handout was given):  -Nutrition: Stressed importance of moderation in sodium/caffeine intake, saturated fat and cholesterol, caloric balance, sufficient intake of fresh fruits, vegetables, and fiber.  -Stressed the importance of regular exercise.   -Substance Abuse: Discussed cessation/primary prevention of tobacco, alcohol, or other drug use; driving or other dangerous activities under the influence; availability of treatment for abuse.   -Injury prevention: Discussed safety belts, safety helmets, smoke detector, smoking near bedding or upholstery.   -Sexuality: Discussed sexually transmitted diseases, partner selection, use of condoms, avoidance of unintended pregnancy and contraceptive alternatives.   -Dental health: Discussed importance of regular tooth brushing, flossing, and dental visits.  -Health maintenance and immunizations reviewed. Please refer to Health maintenance section.  Return to care in 1 year for next preventative visit.     Subjective:  HPI:  She has no acute complaints today. See Assessment / plan for status of chronic conditions.   Lifestyle Diet: None specific.  Exercise: None specific.      12/20/2022   10:13 AM  Depression  screen PHQ 2/9  Decreased Interest 1  Down, Depressed, Hopeless 1  PHQ - 2 Score 2  Altered sleeping 0  Tired, decreased energy 3  Change in appetite 3  Trouble concentrating 0  Moving slowly or fidgety/restless 0  Suicidal thoughts 0  PHQ-9 Score 8  Difficult doing work/chores Not difficult at all    Health Maintenance Due  Topic Date Due   COVID-19 Vaccine (4 - 2023-24 season) 10/31/2022     ROS: Per HPI, otherwise a complete review of systems was negative.   PMH:  The following were reviewed and entered/updated in epic: Past Medical History:  Diagnosis Date   AMA (advanced maternal age) multigravida 35+    Anemia    Asthma    exercise induced   Back pain    Dysrhythmia    SVT ab;atopm 12/10   Herpes    Hypotension    Hypothyroidism    Joint pain    SVT (supraventricular tachycardia) (HCC)    Swelling of both lower extremities    Vitamin D deficiency    Patient Active Problem List   Diagnosis Date Noted   Dyslipidemia 04/03/2020   Mood disorder (HCC), with emotional eating 03/24/2020   Hypothyroidism 01/14/2020   Insulin resistance 01/14/2020   Vitamin D deficiency 01/14/2020   Past Surgical History:  Procedure Laterality Date   CARDIAC CATHETERIZATION  approx. 2007   catheter ablation   CESAREAN SECTION  03/20/2011   Procedure: CESAREAN SECTION;  Surgeon: Freddrick March. Tenny Craw, MD;  Location: [facility];  Service: Gynecology;  Laterality: N/A;  primary cesarean section of baby boy at 55  APGAR 9/9   CESAREAN SECTION WITH BILATERAL TUBAL LIGATION Bilateral 11/01/2017   Procedure: REPEAT CESAREAN SECTION WITH BILATERAL TUBAL LIGATION;  Surgeon: Waynard Reeds,  MD;  Location: [facility];  Service: Obstetrics;  Laterality: Bilateral;  Heather,RNFA   DILATION AND EVACUATION N/A 10/11/2013   Procedure: DILATATION AND EVACUATION;  Surgeon: Loney Laurence, MD;  Location: [facility];  Service: Gynecology;  Laterality: N/A;   HAND SURGERY     NASAL SINUS SURGERY  2009    SVT ABLATION  11/2007    Family History  Problem Relation Age of Onset   Breast cancer Mother 66   Thyroid disease Mother    Liver disease Mother        autoimmune hepatitis   Hypothyroidism Mother    Hypertension Father    Heart disease Father    Hyperlipidemia Father    Thyroid disease Father    Sleep apnea Father    Obesity Father    Stroke Maternal Aunt    Dysrhythmia Maternal Aunt    Hypothyroidism Paternal Aunt    Prostate cancer Maternal Grandfather    Hypothyroidism Paternal Grandmother    Liver cancer Paternal Grandmother    Lung cancer Paternal Grandmother    Heart disease Paternal Grandfather    Hypertension Paternal Grandfather    Hypothyroidism Paternal Grandfather     Medications- reviewed and updated Current Outpatient Medications  Medication Sig Dispense Refill   albuterol (VENTOLIN HFA) 108 (90 Base) MCG/ACT inhaler Inhale 2 puffs into the lungs every 6 (six) hours as needed for wheezing or shortness of breath. 18 g 0   buPROPion (WELLBUTRIN XL) 300 MG 24 hr tablet TAKE 1 TABLET BY MOUTH EVERY DAY 90 tablet 1   ferrous sulfate 325 (65 FE) MG EC tablet Take 325 mg by mouth daily with breakfast.     levothyroxine (SYNTHROID) 112 MCG tablet TAKE 1 TABLET BY MOUTH EVERY DAY BEFORE BREAKFAST 90 tablet 1   MAGNESIUM CARBONATE PO Take 1,000 mg by mouth daily.     Multiple Vitamin (MULTIVITAMIN WITH MINERALS) TABS tablet Take 1 tablet by mouth daily.     nitrofurantoin, macrocrystal-monohydrate, (MACROBID) 100 MG capsule Take 1 capsule (100 mg total) by mouth 2 (two) times daily for 5 days. 10 capsule 0   vitamin C (ASCORBIC ACID) 500 MG tablet Take 500 mg by mouth daily.     Vitamin D, Ergocalciferol, (DRISDOL) 1.25 MG (50000 UNIT) CAPS capsule TAKE ONE TABLET BY MOUTH EVERY 10 DAYS 9 capsule 0   VITAMIN K PO Take 150 mcg by mouth daily.     Zinc Acetate, Oral, (ZINC ACETATE PO) Take by mouth.     No current facility-administered medications for this visit.     Allergies-reviewed and updated Allergies  Allergen Reactions   Adhesive [Tape]    Influenza Vaccines Hives    Water blisters on face and hives on chest and stomach and told not to get again    Social History   Socioeconomic History   Marital status: Married    Spouse name: Molly Maduro   Number of children: Not on file   Years of education: Not on file   Highest education level: Not on file  Occupational History   Occupation: stay at home mom  Tobacco Use   Smoking status: Never   Smokeless tobacco: Never  Vaping Use   Vaping status: Never Used  Substance and Sexual Activity   Alcohol use: No   Drug use: No   Sexual activity: Yes  Other Topics Concern   Not on file  Social History Narrative   Not on file   Social Determinants of Health  Financial Resource Strain: Low Risk  (10/18/2017)   Overall Financial Resource Strain (CARDIA)    Difficulty of Paying Living Expenses: Not hard at all  Food Insecurity: No Food Insecurity (10/18/2017)   Hunger Vital Sign    Worried About Running Out of Food in the Last Year: Never true    Ran Out of Food in the Last Year: Never true  Transportation Needs: No Transportation Needs (10/18/2017)   PRAPARE - Administrator, Civil Service (Medical): No    Lack of Transportation (Non-Medical): No  Physical Activity: Insufficiently Active (10/18/2017)   Exercise Vital Sign    Days of Exercise per Week: 3 days    Minutes of Exercise per Session: 30 min  Stress: No Stress Concern Present (10/18/2017)   [employer] of Occupational Health - Occupational Stress Questionnaire    Feeling of Stress : Only a little  Social Connections: Not on file        Objective:  Physical Exam: BP 121/75   Pulse 84   Temp (!) 96.6 F (35.9 C) (Temporal)   Ht 5\' 5"  (1.651 m)   Wt 238 lb 9.6 oz (108.2 kg)   LMP 12/07/2022   SpO2 96%   BMI 39.71 kg/m   Body mass index is 39.71 kg/m. Wt Readings from Last 3 Encounters:  12/20/22 238  lb 9.6 oz (108.2 kg)  10/20/21 234 lb (106.1 kg)  05/05/20 197 lb (89.4 kg)   Gen: NAD, resting comfortably HEENT: TMs normal bilaterally. OP clear. No thyromegaly noted.  CV: RRR with no murmurs appreciated Pulm: NWOB, CTAB with no crackles, wheezes, or rhonchi GI: Normal bowel sounds present. Soft, Nontender, Nondistended. MSK: no edema, cyanosis, or clubbing noted Skin: warm, dry Neuro: CN2-12 grossly intact. Strength 5/5 in upper and lower extremities. Reflexes symmetric and intact bilaterally.  Psych: Normal affect and thought content     [name] Chavis M. Jimmey Ralph, MD 12/20/2022 10:32 AM

## 2022-12-20 NOTE — Assessment & Plan Note (Signed)
Check TSH.  She is on Synthroid 112 mcg daily. 

## 2022-12-20 NOTE — Patient Instructions (Signed)
It was very nice to see you today!  We will check blood work today.  Please continue to work on diet and exercise.  Will see back in year for your next physical.  Come back sooner if needed.  Return in about 1 year (around 12/20/2023) for Annual Physical.   Take care, Dr Jimmey Ralph  PLEASE NOTE:  If you had any lab tests, please let us know if you have not heard back within a few days. You may see your results on mychart before we have a chance to review them but we will give you a call once they are reviewed by [place].   If we ordered any referrals today, please let us know if you have not heard from their office within the next week.   If you had any urgent prescriptions sent in today, please check with the pharmacy within an hour of our visit to make sure the prescription was transmitted appropriately.   Please try these tips to maintain a healthy lifestyle:  Eat at least 3 REAL meals and 1-2 snacks per day.  Aim for no more than 5 hours between eating.  If you eat breakfast, please do so within one hour of getting up.   Each meal should contain half fruits/vegetables, one quarter protein, and one quarter carbs (no bigger than a computer mouse)  Cut down on sweet beverages. This includes juice, soda, and sweet tea.   Drink at least 1 glass of water with each meal and aim for at least 8 glasses per day  Exercise at least 150 minutes every week.    Preventive Care 49-64 Years Old, Female Preventive care refers to lifestyle choices and visits with your health care provider that can promote health and wellness. Preventive care visits are also called wellness exams. What can I expect for my preventive care visit? Counseling Your health care provider may ask you questions about your: Medical history, including: Past medical problems. Family medical history. Pregnancy history. Current health, including: Menstrual cycle. Method of birth control. Emotional well-being. Home life and  relationship well-being. Sexual activity and sexual health. Lifestyle, including: Alcohol, nicotine or tobacco, and drug use. Access to firearms. Diet, exercise, and sleep habits. Work and work Astronomer. Sunscreen use. Safety issues such as seatbelt and bike helmet use. Physical exam Your health care provider will check your: Height and weight. These may be used to calculate your BMI (body mass index). BMI is a measurement that tells if you are at a healthy weight. Waist circumference. This measures the distance around your waistline. This measurement also tells if you are at a healthy weight and may help predict your risk of certain diseases, such as type 2 diabetes and high blood pressure. Heart rate and blood pressure. Body temperature. Skin for abnormal spots. What immunizations do I need?  Vaccines are usually given at various ages, according to a schedule. Your health care provider will recommend vaccines for you based on your age, medical history, and lifestyle or other factors, such as travel or where you work. What tests do I need? Screening Your health care provider may recommend screening tests for certain conditions. This may include: Lipid and cholesterol levels. Diabetes screening. This is done by checking your blood sugar (glucose) after you have not eaten for a while (fasting). Pelvic exam and Pap test. Hepatitis B test. Hepatitis C test. HIV (human immunodeficiency virus) test. STI (sexually transmitted infection) testing, if you are at risk. Lung cancer screening. Colorectal cancer screening. Mammogram.  Talk with your health care provider about when you should start having regular mammograms. This may depend on whether you have a family history of breast cancer. BRCA-related cancer screening. This may be done if you have a family history of breast, ovarian, tubal, or peritoneal cancers. Bone density scan. This is done to screen for osteoporosis. Talk with your  health care provider about your test results, treatment options, and if necessary, the need for more tests. Follow these instructions at home: Eating and drinking  Eat a diet that includes fresh fruits and vegetables, whole grains, lean protein, and low-fat dairy products. Take vitamin and mineral supplements as recommended by your health care provider. Do not drink alcohol if: Your health care provider tells you not to drink. You are pregnant, may be pregnant, or are planning to become pregnant. If you drink alcohol: Limit how much you have to 0-1 drink a day. Know how much alcohol is in your drink. In the [place] one drink equals one 12 oz bottle of beer (355 mL), one 5 oz glass of wine (148 mL), or one 1 oz glass of hard liquor (44 mL). Lifestyle Brush your teeth every morning and night with fluoride toothpaste. Floss one time each day. Exercise for at least 30 minutes 5 or more days each week. Do not use any products that contain nicotine or tobacco. These products include cigarettes, chewing tobacco, and vaping devices, such as e-cigarettes. If you need help quitting, ask your health care provider. Do not use drugs. If you are sexually active, practice safe sex. Use a condom or other form of protection to prevent STIs. If you do not wish to become pregnant, use a form of birth control. If you plan to become pregnant, see your health care provider for a prepregnancy visit. Take aspirin only as told by your health care provider. Make sure that you understand how much to take and what form to take. Work with your health care provider to find out whether it is safe and beneficial for you to take aspirin daily. Find healthy ways to manage stress, such as: Meditation, yoga, or listening to music. Journaling. Talking to a trusted person. Spending time with friends and family. Minimize exposure to UV radiation to reduce your risk of skin cancer. Safety Always wear your seat belt while driving  or riding in a vehicle. Do not drive: If you have been drinking alcohol. Do not ride with someone who has been drinking. When you are tired or distracted. While texting. If you have been using any mind-altering substances or drugs. Wear a helmet and other protective equipment during sports activities. If you have firearms in your house, make sure you follow all gun safety procedures. Seek help if you have been physically or sexually abused. What's next? Visit your health care provider once a year for an annual wellness visit. Ask your health care provider how often you should have your eyes and teeth checked. Stay up to date on all vaccines. This information is not intended to replace advice given to you by your health care provider. Make sure you discuss any questions you have with your health care provider. Document Revised: 08/13/2020 Document Reviewed: 08/13/2020 Elsevier Patient Education  2024 [employer].

## 2022-12-20 NOTE — Assessment & Plan Note (Signed)
Check lipids. Discussed lifestyle modifications.  

## 2022-12-20 NOTE — Assessment & Plan Note (Signed)
Check A1c. 

## 2022-12-23 NOTE — Progress Notes (Signed)
Vitamin D is a little bit low.  Recommend starting supplementation 1000 to 2000 IUs daily and we can recheck in 3 to 6 months.  Her cholesterol is up a little bit since last time we checked as well.  She should work on diet and exercise and we can recheck this in a year.  The rest of her labs are all stable.

## 2022-12-29 ENCOUNTER — Encounter: Payer: Self-pay | Admitting: Family Medicine

## 2022-12-30 NOTE — Telephone Encounter (Signed)
Recommend she change to 50000 IU weekly instead of every 10 days if she wishes to do that instead.  We can refill everything else if needed.  Katina Degree. Jimmey Ralph, MD 12/30/2022 12:57 PM

## 2022-12-30 NOTE — Telephone Encounter (Signed)
Please advise 

## 2022-12-31 ENCOUNTER — Other Ambulatory Visit: Payer: Self-pay | Admitting: *Deleted

## 2022-12-31 MED ORDER — BUPROPION HCL ER (XL) 300 MG PO TB24: 300.0000 mg | ORAL_TABLET | Freq: Every day | ORAL | 1 refills | Status: DC

## 2023-01-11 ENCOUNTER — Other Ambulatory Visit: Payer: Self-pay | Admitting: Obstetrics and Gynecology

## 2023-01-11 DIAGNOSIS — Z0000 Encounter for general adult medical examination without abnormal findings: Secondary | ICD-10-CM

## 2023-02-04 ENCOUNTER — Other Ambulatory Visit: Payer: Self-pay | Admitting: Family Medicine

## 2023-02-04 DIAGNOSIS — E559 Vitamin D deficiency, unspecified: Secondary | ICD-10-CM

## 2023-03-22 ENCOUNTER — Ambulatory Visit
Admission: RE | Admit: 2023-03-22 | Discharge: 2023-03-22 | Disposition: A | Discharge status: Home / Self Care | Payer: Federal, State, Local not specified - PPO | Source: Ambulatory Visit | Attending: Obstetrics and Gynecology | Admitting: Obstetrics and Gynecology

## 2023-03-22 DIAGNOSIS — Z0000 Encounter for general adult medical examination without abnormal findings: Secondary | ICD-10-CM

## 2023-03-22 DIAGNOSIS — Z1231 Encounter for screening mammogram for malignant neoplasm of breast: Secondary | ICD-10-CM | POA: Diagnosis not present

## 2023-04-19 DIAGNOSIS — Z01419 Encounter for gynecological examination (general) (routine) without abnormal findings: Secondary | ICD-10-CM | POA: Diagnosis not present

## 2023-04-28 ENCOUNTER — Telehealth: Payer: Federal, State, Local not specified - PPO | Admitting: Physician Assistant

## 2023-04-28 DIAGNOSIS — H109 Unspecified conjunctivitis: Secondary | ICD-10-CM

## 2023-04-28 MED ORDER — POLYMYXIN B-TRIMETHOPRIM 10000-0.1 UNIT/ML-% OP SOLN: OPHTHALMIC | 0 refills | Status: DC

## 2023-04-28 NOTE — Progress Notes (Signed)

## 2023-04-28 NOTE — Progress Notes (Signed)
 I have spent 5 minutes in review of e-visit questionnaire, review and updating patient chart, medical decision making and response to patient.   Piedad Climes, PA-C

## 2023-04-28 NOTE — Progress Notes (Signed)
 Message sent to patient requesting further input regarding current symptoms. Awaiting patient response.

## 2023-05-03 ENCOUNTER — Other Ambulatory Visit: Payer: Self-pay | Admitting: Family Medicine

## 2023-05-03 DIAGNOSIS — E559 Vitamin D deficiency, unspecified: Secondary | ICD-10-CM

## 2023-06-03 ENCOUNTER — Other Ambulatory Visit: Payer: Self-pay | Admitting: Family Medicine

## 2023-06-08 DIAGNOSIS — K080 Exfoliation of teeth due to systemic causes: Secondary | ICD-10-CM | POA: Diagnosis not present

## 2023-07-02 ENCOUNTER — Other Ambulatory Visit: Payer: Self-pay | Admitting: Family Medicine

## 2023-07-02 DIAGNOSIS — E559 Vitamin D deficiency, unspecified: Secondary | ICD-10-CM

## 2023-07-04 MED ORDER — VITAMIN D (ERGOCALCIFEROL) 1.25 MG (50000 UNIT) PO CAPS: ORAL_CAPSULE | ORAL | 0 refills | Status: AC

## 2023-09-22 ENCOUNTER — Other Ambulatory Visit: Payer: Self-pay | Admitting: Family Medicine

## 2023-11-07 DIAGNOSIS — M25561 Pain in right knee: Secondary | ICD-10-CM | POA: Diagnosis not present

## 2023-11-25 ENCOUNTER — Other Ambulatory Visit: Payer: Self-pay | Admitting: Family Medicine

## 2023-11-26 DIAGNOSIS — M25561 Pain in right knee: Secondary | ICD-10-CM | POA: Diagnosis not present

## 2023-12-09 DIAGNOSIS — M25561 Pain in right knee: Secondary | ICD-10-CM | POA: Diagnosis not present

## 2023-12-19 ENCOUNTER — Telehealth: Admitting: Physician Assistant

## 2023-12-19 DIAGNOSIS — R3989 Other symptoms and signs involving the genitourinary system: Secondary | ICD-10-CM

## 2023-12-19 MED ORDER — NITROFURANTOIN MONOHYD MACRO 100 MG PO CAPS: 100.0000 mg | ORAL_CAPSULE | Freq: Two times a day (BID) | ORAL | 0 refills | Status: AC

## 2023-12-19 NOTE — Progress Notes (Signed)

## 2023-12-22 ENCOUNTER — Encounter: Payer: Self-pay | Admitting: Family Medicine

## 2023-12-22 ENCOUNTER — Ambulatory Visit: Payer: Federal, State, Local not specified - PPO | Admitting: Family Medicine

## 2023-12-22 VITALS — BP 118/80 | HR 75 | Temp 97.3°F | Ht 65.0 in | Wt 225.6 lb

## 2023-12-22 DIAGNOSIS — Z0001 Encounter for general adult medical examination with abnormal findings: Secondary | ICD-10-CM | POA: Diagnosis not present

## 2023-12-22 DIAGNOSIS — E88819 Insulin resistance, unspecified: Secondary | ICD-10-CM | POA: Diagnosis not present

## 2023-12-22 DIAGNOSIS — E559 Vitamin D deficiency, unspecified: Secondary | ICD-10-CM

## 2023-12-22 DIAGNOSIS — M25561 Pain in right knee: Secondary | ICD-10-CM

## 2023-12-22 DIAGNOSIS — E039 Hypothyroidism, unspecified: Secondary | ICD-10-CM | POA: Diagnosis not present

## 2023-12-22 DIAGNOSIS — F39 Unspecified mood [affective] disorder: Secondary | ICD-10-CM

## 2023-12-22 DIAGNOSIS — E785 Hyperlipidemia, unspecified: Secondary | ICD-10-CM | POA: Diagnosis not present

## 2023-12-22 DIAGNOSIS — Z23 Encounter for immunization: Secondary | ICD-10-CM

## 2023-12-22 LAB — COMPREHENSIVE METABOLIC PANEL WITH GFR
ALT: 17 U/L (ref 0–35)
AST: 12 U/L (ref 0–37)
Albumin: 4.7 g/dL (ref 3.5–5.2)
Alkaline Phosphatase: 48 U/L (ref 39–117)
BUN: 16 mg/dL (ref 6–23)
CO2: 29 meq/L (ref 19–32)
Calcium: 9.6 mg/dL (ref 8.4–10.5)
Chloride: 102 meq/L (ref 96–112)
Creatinine, Ser: 0.85 mg/dL (ref 0.40–1.20)
GFR: 78.98 mL/min (ref >60.00)
Glucose, Bld: 103 mg/dL — ABNORMAL HIGH (ref 70–99)
Potassium: 4.3 meq/L (ref 3.5–5.1)
Sodium: 140 meq/L (ref 135–145)
Total Bilirubin: 0.6 mg/dL (ref 0.2–1.2)
Total Protein: 7.1 g/dL (ref 6.0–8.3)

## 2023-12-22 LAB — LIPID PANEL
Cholesterol: 244 mg/dL — ABNORMAL HIGH (ref 0–200)
HDL: 68.3 mg/dL (ref >39.00)
LDL Cholesterol: 159 mg/dL — ABNORMAL HIGH (ref 0–99)
NonHDL: 175.84
Total CHOL/HDL Ratio: 4
Triglycerides: 84 mg/dL (ref 0.0–149.0)
VLDL: 16.8 mg/dL (ref 0.0–40.0)

## 2023-12-22 LAB — TSH: TSH: 0.43 u[IU]/mL (ref 0.35–5.50)

## 2023-12-22 LAB — HEMOGLOBIN A1C: Hgb A1c MFr Bld: 5.8 % (ref 4.6–6.5)

## 2023-12-22 LAB — T3, FREE: T3, Free: 3 pg/mL (ref 2.3–4.2)

## 2023-12-22 LAB — CBC
HCT: 42.8 % (ref 36.0–46.0)
Hemoglobin: 14.3 g/dL (ref 12.0–15.0)
MCHC: 33.5 g/dL (ref 30.0–36.0)
MCV: 88.1 fl (ref 78.0–100.0)
Platelets: 341 K/uL (ref 150.0–400.0)
RBC: 4.86 Mil/uL (ref 3.87–5.11)
RDW: 12.9 % (ref 11.5–15.5)
WBC: 5.8 K/uL (ref 4.0–10.5)

## 2023-12-22 LAB — VITAMIN D 25 HYDROXY (VIT D DEFICIENCY, FRACTURES): VITD: 62.82 ng/mL (ref 30.00–100.00)

## 2023-12-22 LAB — T4, FREE: Free T4: 1.33 ng/dL (ref 0.60–1.60)

## 2023-12-22 MED ORDER — BUPROPION HCL ER (XL) 300 MG PO TB24: 300.0000 mg | ORAL_TABLET | Freq: Every day | ORAL | 3 refills | Status: AC

## 2023-12-22 MED ORDER — LEVOTHYROXINE SODIUM 112 MCG PO TABS: ORAL_TABLET | ORAL | 3 refills | Status: AC

## 2023-12-22 NOTE — Patient Instructions (Signed)
 It was very nice to see you today!  VISIT SUMMARY: You came in for your annual physical exam and to receive your flu and pneumonia vaccines. We also discussed your thyroid  health, knee pain, and overall wellness.  YOUR PLAN: MENISCAL TEAR OF RIGHT KNEE: You have a tear in the meniscus of your right knee and are scheduled for arthroscopic surgery. -Proceed with the scheduled arthroscopy and keep your phone available for surgery scheduling communication.  HYPOTHYROIDISM: You have a history of hypothyroidism and are currently taking levothyroxine . -We will order a full thyroid  panel including TSH, T3, and T4. -Your levothyroxine  prescription has been refilled.  MAJOR DEPRESSIVE DISORDER: You are managing major depressive disorder with bupropion . -Your bupropion  prescription has been refilled.  OVERWEIGHT: You have recently lost 20 pounds through dietary changes. -Continue your current dietary habits to maintain weight loss.  ADULT WELLNESS VISIT: This is your annual physical exam. -We performed a physical examination including checking your heart and lungs.  IMMUNIZATIONS: You are due for your flu and pneumonia vaccines. -We administered the influenza and pneumococcal vaccines today.  Return in about 1 year (around 12/21/2024) for Annual Physical.   Take care, Dr Kennyth  PLEASE NOTE:  If you had any lab tests, please let us  know if you have not heard back within a few days. You may see your results on mychart before we have a chance to review them but we will give you a call once they are reviewed by us .   If we ordered any referrals today, please let us  know if you have not heard from their office within the next week.   If you had any urgent prescriptions sent in today, please check with the pharmacy within an hour of our visit to make sure the prescription was transmitted appropriately.   Please try these tips to maintain a healthy lifestyle:  Eat at least 3 REAL meals and 1-2  snacks per day.  Aim for no more than 5 hours between eating.  If you eat breakfast, please do so within one hour of getting up.   Each meal should contain half fruits/vegetables, one quarter protein, and one quarter carbs (no bigger than a computer mouse)  Cut down on sweet beverages. This includes juice, soda, and sweet tea.   Drink at least 1 glass of water with each meal and aim for at least 8 glasses per day  Exercise at least 150 minutes every week.    Preventive Care 45-66 Years Old, Female Preventive care refers to lifestyle choices and visits with your health care provider that can promote health and wellness. Preventive care visits are also called wellness exams. What can I expect for my preventive care visit? Counseling Your health care provider may ask you questions about your: Medical history, including: Past medical problems. Family medical history. Pregnancy history. Current health, including: Menstrual cycle. Method of birth control. Emotional well-being. Home life and relationship well-being. Sexual activity and sexual health. Lifestyle, including: Alcohol, nicotine or tobacco, and drug use. Access to firearms. Diet, exercise, and sleep habits. Work and work Astronomer. Sunscreen use. Safety issues such as seatbelt and bike helmet use. Physical exam Your health care provider will check your: Height and weight. These may be used to calculate your BMI (body mass index). BMI is a measurement that tells if you are at a healthy weight. Waist circumference. This measures the distance around your waistline. This measurement also tells if you are at a healthy weight and may help predict  your risk of certain diseases, such as type 2 diabetes and high blood pressure. Heart rate and blood pressure. Body temperature. Skin for abnormal spots. What immunizations do I need?  Vaccines are usually given at various ages, according to a schedule. Your health care provider will  recommend vaccines for you based on your age, medical history, and lifestyle or other factors, such as travel or where you work. What tests do I need? Screening Your health care provider may recommend screening tests for certain conditions. This may include: Lipid and cholesterol levels. Diabetes screening. This is done by checking your blood sugar (glucose) after you have not eaten for a while (fasting). Pelvic exam and Pap test. Hepatitis B test. Hepatitis C test. HIV (human immunodeficiency virus) test. STI (sexually transmitted infection) testing, if you are at risk. Lung cancer screening. Colorectal cancer screening. Mammogram. Talk with your health care provider about when you should start having regular mammograms. This may depend on whether you have a family history of breast cancer. BRCA-related cancer screening. This may be done if you have a family history of breast, ovarian, tubal, or peritoneal cancers. Bone density scan. This is done to screen for osteoporosis. Talk with your health care provider about your test results, treatment options, and if necessary, the need for more tests. Follow these instructions at home: Eating and drinking  Eat a diet that includes fresh fruits and vegetables, whole grains, lean protein, and low-fat dairy products. Take vitamin and mineral supplements as recommended by your health care provider. Do not drink alcohol if: Your health care provider tells you not to drink. You are pregnant, may be pregnant, or are planning to become pregnant. If you drink alcohol: Limit how much you have to 0-1 drink a day. Know how much alcohol is in your drink. In the [place] one drink equals one 12 oz bottle of beer (355 mL), one 5 oz glass of wine (148 mL), or one 1 oz glass of hard liquor (44 mL). Lifestyle Brush your teeth every morning and night with fluoride toothpaste. Floss one time each day. Exercise for at least 30 minutes 5 or more days each week. Do  not use any products that contain nicotine or tobacco. These products include cigarettes, chewing tobacco, and vaping devices, such as e-cigarettes. If you need help quitting, ask your health care provider. Do not use drugs. If you are sexually active, practice safe sex. Use a condom or other form of protection to prevent STIs. If you do not wish to become pregnant, use a form of birth control. If you plan to become pregnant, see your health care provider for a prepregnancy visit. Take aspirin only as told by your health care provider. Make sure that you understand how much to take and what form to take. Work with your health care provider to find out whether it is safe and beneficial for you to take aspirin daily. Find healthy ways to manage stress, such as: Meditation, yoga, or listening to music. Journaling. Talking to a trusted person. Spending time with friends and family. Minimize exposure to UV radiation to reduce your risk of skin cancer. Safety Always wear your seat belt while driving or riding in a vehicle. Do not drive: If you have been drinking alcohol. Do not ride with someone who has been drinking. When you are tired or distracted. While texting. If you have been using any mind-altering substances or drugs. Wear a helmet and other protective equipment during sports activities. If you have  firearms in your house, make sure you follow all gun safety procedures. Seek help if you have been physically or sexually abused. What's next? Visit your health care provider once a year for an annual wellness visit. Ask your health care provider how often you should have your eyes and teeth checked. Stay up to date on all vaccines. This information is not intended to replace advice given to you by your health care provider. Make sure you discuss any questions you have with your health care provider. Document Revised: 08/13/2020 Document Reviewed: 08/13/2020 Elsevier Patient Education  2024  [employer].

## 2023-12-22 NOTE — Assessment & Plan Note (Signed)
 Check vitamin D.

## 2023-12-22 NOTE — Progress Notes (Signed)
 Chief Complaint:  Destiny [name] is a 53 y.o. female who presents today for her annual comprehensive physical exam.    Assessment/Plan:  Chronic Problems Addressed Today: Hypothyroidism On Synthroid  112 mcg daily.  Check labs.   Insulin  resistance Check A1c.  She is doing a great job with dietary modification.  Vitamin D  deficiency Check vitamin D .  Mood disorder (HCC), with emotional eating Stable on Wellbutrin  300 mg daily.  Dyslipidemia Check lipids.   Right knee pain Diagnosed with meniscal tear.  Following with orthopedics and has upcoming arthroscopic surgery.  Preventative Healthcare: Flu and pneumonia vaccines given today.  Check labs.  Follows with gynecology for [employer] health.  Patient Counseling(The following topics were reviewed and/or handout was given):  -Nutrition: Stressed importance of moderation in sodium/caffeine intake, saturated fat and cholesterol, caloric balance, sufficient intake of fresh fruits, vegetables, and fiber.  -Stressed the importance of regular exercise.   -Substance Abuse: Discussed cessation/primary prevention of tobacco, alcohol, or other drug use; driving or other dangerous activities under the influence; availability of treatment for abuse.   -Injury prevention: Discussed safety belts, safety helmets, smoke detector, smoking near bedding or upholstery.   -Sexuality: Discussed sexually transmitted diseases, partner selection, use of condoms, avoidance of unintended pregnancy and contraceptive alternatives.   -Dental health: Discussed importance of regular tooth brushing, flossing, and dental visits.  -Health maintenance and immunizations reviewed. Please refer to Health maintenance section.  Return to care in 1 year for next preventative visit.     Subjective:  HPI:  She has no acute complaints today. Patient is here today for her annual physical.  See assessment / plan for status of chronic conditions.  Discussed the use of AI  scribe software for clinical note transcription with the patient, who gave verbal consent to proceed.  History of Present Illness Destiny [name] is a 53 year old female who presents for an annual physical exam and vaccination updates.  She is here for her annual physical exam and to receive her flu and pneumonia vaccines. She had initially scheduled an appointment for the vaccines but decided to combine it with her physical exam.  She requests a full thyroid  panel as it has been a few years since her last comprehensive check, and she has had abnormal thyroid  levels in the past. Previously, only TSH levels were monitored, and she would like to check her T3 and T4 levels as well.  She has a urinary tract infection that began a few days ago. Her symptoms are improving.  She has a history of knee pain due to two meniscus tears, which occurred while lifting her daughter from the bathtub. She is awaiting a call to finalize the date for arthroscopic surgery to address two meniscus tears.  She is currently taking Wellbutrin  and Synthroid . She has lost 20 pounds recently, primarily through dietary changes, including reducing cheese intake and late-night snacking. She has not been able to exercise much due to her knee issues.    Lifestyle Diet: Reducing portion sizes Exercise: Limited.      12/22/2023   10:03 AM  Depression screen PHQ 2/9  Decreased Interest 0  Down, Depressed, Hopeless 0  PHQ - 2 Score 0    Health Maintenance Due  Topic Date Due   Pneumococcal Vaccine: 50+ Years (1 of 2 - PCV) Never done   Influenza Vaccine  09/30/2023     ROS: Per HPI, otherwise a complete review of systems was negative.   PMH:  The  following were reviewed and entered/updated in epic: Past Medical History:  Diagnosis Date   AMA (advanced maternal age) multigravida 35+    Anemia    Asthma    exercise induced   Back pain    Dysrhythmia    SVT ab;atopm 12/10   Herpes    Hypotension     Hypothyroidism    Joint pain    SVT (supraventricular tachycardia)    Swelling of both lower extremities    Vitamin D  deficiency    Patient Active Problem List   Diagnosis Date Noted   Right knee pain 12/22/2023   Dyslipidemia 04/03/2020   Mood disorder (HCC), with emotional eating 03/24/2020   Hypothyroidism 01/14/2020   Insulin  resistance 01/14/2020   Vitamin D  deficiency 01/14/2020   Past Surgical History:  Procedure Laterality Date   CARDIAC CATHETERIZATION  approx. 2007   catheter ablation   CESAREAN SECTION  03/20/2011   Procedure: CESAREAN SECTION;  Surgeon: Marjorie DEL. Okey, MD;  Location: [facility];  Service: Gynecology;  Laterality: N/A;  primary cesarean section of baby boy at 48  APGAR 9/9   CESAREAN SECTION WITH BILATERAL TUBAL LIGATION Bilateral 11/01/2017   Procedure: REPEAT CESAREAN SECTION WITH BILATERAL TUBAL LIGATION;  Surgeon: Okey Marjorie, MD;  Location: [facility];  Service: Obstetrics;  Laterality: Bilateral;  Heather,RNFA   DILATION AND EVACUATION N/A 10/11/2013   Procedure: DILATATION AND EVACUATION;  Surgeon: Rosaline DELENA Luna, MD;  Location: [facility];  Service: Gynecology;  Laterality: N/A;   HAND SURGERY     NASAL SINUS SURGERY  2009   SVT ABLATION  11/2007    Family History  Problem Relation Age of Onset   Breast cancer Mother 46   Thyroid  disease Mother    Liver disease Mother        autoimmune hepatitis   Hypothyroidism Mother    Hypertension Father    Heart disease Father    Hyperlipidemia Father    Thyroid  disease Father    Sleep apnea Father    Obesity Father    Stroke Maternal Aunt    Dysrhythmia Maternal Aunt    Hypothyroidism Paternal Aunt    Prostate cancer Maternal Grandfather    Hypothyroidism Paternal Grandmother    Liver cancer Paternal Grandmother    Lung cancer Paternal Grandmother    Heart disease Paternal Grandfather    Hypertension Paternal Grandfather    Hypothyroidism Paternal Grandfather     Medications-  reviewed and updated Current Outpatient Medications  Medication Sig Dispense Refill   ferrous sulfate 325 (65 FE) MG EC tablet Take 325 mg by mouth daily with breakfast.     MAGNESIUM CARBONATE PO Take 1,000 mg by mouth daily.     Multiple Vitamin (MULTIVITAMIN WITH MINERALS) TABS tablet Take 1 tablet by mouth daily.     nitrofurantoin , macrocrystal-monohydrate, (MACROBID ) 100 MG capsule Take 1 capsule (100 mg total) by mouth 2 (two) times daily for 7 days. 14 capsule 0   vitamin C (ASCORBIC ACID) 500 MG tablet Take 500 mg by mouth daily.     Vitamin D , Ergocalciferol , (DRISDOL ) 1.25 MG (50000 UNIT) CAPS capsule TAKE ONE TABLET BY MOUTH EVERY 10 DAYS 9 capsule 0   VITAMIN K PO Take 150 mcg by mouth daily.     Zinc  Acetate, Oral, (ZINC  ACETATE PO) Take by mouth.     buPROPion  (WELLBUTRIN  XL) 300 MG 24 hr tablet Take 1 tablet (300 mg total) by mouth daily. 90 tablet 3   levothyroxine  (SYNTHROID ) 112  MCG tablet TAKE 1 TABLET BY MOUTH EVERY DAY BEFORE BREAKFAST 90 tablet 3   No current facility-administered medications for this visit.    Allergies-reviewed and updated Allergies  Allergen Reactions   Adhesive [Tape]    Influenza Vaccines Hives    Water blisters on face and hives on chest and stomach and told not to get again    Social History   Socioeconomic History   Marital status: Married    Spouse name: Lamar   Number of children: Not on file   Years of education: Not on file   Highest education level: Not on file  Occupational History   Occupation: stay at home mom  Tobacco Use   Smoking status: Never   Smokeless tobacco: Never  Vaping Use   Vaping status: Never Used  Substance and Sexual Activity   Alcohol use: No   Drug use: No   Sexual activity: Yes  Other Topics Concern   Not on file  Social History Narrative   Not on file   Social Drivers of Health   Financial Resource Strain: Low Risk  (10/18/2017)   Overall Financial Resource Strain (CARDIA)    Difficulty  of Paying Living Expenses: Not hard at all  Food Insecurity: No Food Insecurity (10/18/2017)   Hunger Vital Sign    Worried About Running Out of Food in the Last Year: Never true    Ran Out of Food in the Last Year: Never true  Transportation Needs: No Transportation Needs (10/18/2017)   PRAPARE - Administrator, Civil Service (Medical): No    Lack of Transportation (Non-Medical): No  Physical Activity: Insufficiently Active (10/18/2017)   Exercise Vital Sign    Days of Exercise per Week: 3 days    Minutes of Exercise per Session: 30 min  Stress: No Stress Concern Present (10/18/2017)   [employer] of Occupational Health - Occupational Stress Questionnaire    Feeling of Stress : Only a little  Social Connections: Not on file        Objective:  Physical Exam: BP 118/80   Pulse 75   Temp (!) 97.3 F (36.3 C) (Temporal)   Ht 5' 5 (1.651 m)   Wt 225 lb 9.6 oz (102.3 kg)   LMP 12/06/2023   SpO2 97%   BMI 37.54 kg/m   Body mass index is 37.54 kg/m. Wt Readings from Last 3 Encounters:  12/22/23 225 lb 9.6 oz (102.3 kg)  12/20/22 238 lb 9.6 oz (108.2 kg)  10/20/21 234 lb (106.1 kg)   Gen: NAD, resting comfortably HEENT: TMs normal bilaterally. OP clear. No thyromegaly noted.  CV: RRR with no murmurs appreciated Pulm: NWOB, CTAB with no crackles, wheezes, or rhonchi GI: Normal bowel sounds present. Soft, Nontender, Nondistended. MSK: no edema, cyanosis, or clubbing noted Skin: warm, dry Neuro: CN2-12 grossly intact. Strength 5/5 in upper and lower extremities. Reflexes symmetric and intact bilaterally.  Psych: Normal affect and thought content     Gisele Pack M. Kennyth, MD 12/22/2023 10:30 AM

## 2023-12-22 NOTE — Assessment & Plan Note (Signed)
 Diagnosed with meniscal tear.  Following with orthopedics and has upcoming arthroscopic surgery.

## 2023-12-22 NOTE — Assessment & Plan Note (Signed)
 Check lipids

## 2023-12-22 NOTE — Assessment & Plan Note (Signed)
 On Synthroid  112 mcg daily.  Check labs.

## 2023-12-22 NOTE — Assessment & Plan Note (Signed)
 Stable on Wellbutrin 300 mg daily.

## 2023-12-22 NOTE — Assessment & Plan Note (Signed)
 Check A1c.  She is doing a great job with dietary modification.

## 2023-12-23 ENCOUNTER — Ambulatory Visit: Payer: Self-pay | Admitting: Family Medicine

## 2023-12-23 DIAGNOSIS — E785 Hyperlipidemia, unspecified: Secondary | ICD-10-CM

## 2023-12-23 NOTE — Progress Notes (Signed)
 Cholesterol and A1c are trending up.  Do not need to start meds for this but is very important that she continue to work on diet and exercise and we can recheck this again in a year.  The rest of her labs are all at goal and we can recheck everything else next year.

## 2024-02-06 DIAGNOSIS — M1711 Unilateral primary osteoarthritis, right knee: Secondary | ICD-10-CM | POA: Diagnosis not present

## 2024-02-06 DIAGNOSIS — M94261 Chondromalacia, right knee: Secondary | ICD-10-CM | POA: Diagnosis not present

## 2024-02-06 DIAGNOSIS — Y999 Unspecified external cause status: Secondary | ICD-10-CM | POA: Diagnosis not present

## 2024-02-06 DIAGNOSIS — S83241A Other tear of medial meniscus, current injury, right knee, initial encounter: Secondary | ICD-10-CM | POA: Diagnosis not present

## 2024-02-06 DIAGNOSIS — S83271A Complex tear of lateral meniscus, current injury, right knee, initial encounter: Secondary | ICD-10-CM | POA: Diagnosis not present

## 2024-02-06 DIAGNOSIS — X58XXXA Exposure to other specified factors, initial encounter: Secondary | ICD-10-CM | POA: Diagnosis not present

## 2024-02-06 DIAGNOSIS — S83281A Other tear of lateral meniscus, current injury, right knee, initial encounter: Secondary | ICD-10-CM | POA: Diagnosis not present

## 2024-02-06 DIAGNOSIS — G8918 Other acute postprocedural pain: Secondary | ICD-10-CM | POA: Diagnosis not present

## 2024-02-27 DIAGNOSIS — H52203 Unspecified astigmatism, bilateral: Secondary | ICD-10-CM | POA: Diagnosis not present

## 2024-12-25 ENCOUNTER — Encounter: Admitting: Family Medicine
